# Patient Record
Sex: Male | Born: 1935 | Race: Asian | Hispanic: No | Marital: Married | Smoking: Former smoker
Health system: Southern US, Community
[De-identification: ages and names within clinical notes are randomized; demographics above are authoritative.]

## PROBLEM LIST (undated history)

## (undated) DIAGNOSIS — I10 Essential (primary) hypertension: Secondary | ICD-10-CM

## (undated) DIAGNOSIS — I2111 ST elevation (STEMI) myocardial infarction involving right coronary artery: Secondary | ICD-10-CM

## (undated) DIAGNOSIS — F0390 Unspecified dementia without behavioral disturbance: Secondary | ICD-10-CM

## (undated) DIAGNOSIS — I5042 Chronic combined systolic (congestive) and diastolic (congestive) heart failure: Secondary | ICD-10-CM

## (undated) DIAGNOSIS — R55 Syncope and collapse: Secondary | ICD-10-CM

## (undated) DIAGNOSIS — I2510 Atherosclerotic heart disease of native coronary artery without angina pectoris: Secondary | ICD-10-CM

## (undated) DIAGNOSIS — N183 Chronic kidney disease, stage 3 unspecified: Secondary | ICD-10-CM

## (undated) DIAGNOSIS — D539 Nutritional anemia, unspecified: Secondary | ICD-10-CM

## (undated) DIAGNOSIS — Z8673 Personal history of transient ischemic attack (TIA), and cerebral infarction without residual deficits: Secondary | ICD-10-CM

## (undated) DIAGNOSIS — R300 Dysuria: Secondary | ICD-10-CM

## (undated) DIAGNOSIS — R4182 Altered mental status, unspecified: Secondary | ICD-10-CM

## (undated) DIAGNOSIS — Z9889 Other specified postprocedural states: Secondary | ICD-10-CM

## (undated) DIAGNOSIS — R609 Edema, unspecified: Secondary | ICD-10-CM

## (undated) DIAGNOSIS — E785 Hyperlipidemia, unspecified: Secondary | ICD-10-CM

## (undated) DIAGNOSIS — C61 Malignant neoplasm of prostate: Secondary | ICD-10-CM

## (undated) DIAGNOSIS — I639 Cerebral infarction, unspecified: Secondary | ICD-10-CM

## (undated) HISTORY — DX: Personal history of transient ischemic attack (TIA), and cerebral infarction without residual deficits: Z86.73

## (undated) HISTORY — DX: Syncope and collapse: R55

## (undated) HISTORY — DX: Atherosclerotic heart disease of native coronary artery without angina pectoris: I25.10

## (undated) HISTORY — DX: Chronic kidney disease, stage 3 unspecified: N18.30

## (undated) HISTORY — DX: Edema, unspecified: R60.9

## (undated) HISTORY — DX: Other specified postprocedural states: Z98.890

## (undated) HISTORY — PX: CATARACT EXTRACTION: SUR2

## (undated) HISTORY — DX: Dysuria: R30.0

## (undated) HISTORY — DX: Essential (primary) hypertension: I10

## (undated) HISTORY — DX: Unspecified dementia, unspecified severity, without behavioral disturbance, psychotic disturbance, mood disturbance, and anxiety: F03.90

## (undated) HISTORY — DX: Nutritional anemia, unspecified: D53.9

## (undated) HISTORY — DX: Chronic kidney disease, stage 3 (moderate): N18.3

## (undated) HISTORY — DX: Malignant neoplasm of prostate: C61

## (undated) HISTORY — DX: Altered mental status, unspecified: R41.82

---

## 2009-02-07 ENCOUNTER — Encounter: Payer: Self-pay | Admitting: Internal Medicine

## 2010-01-11 DIAGNOSIS — R55 Syncope and collapse: Secondary | ICD-10-CM

## 2010-01-11 HISTORY — DX: Syncope and collapse: R55

## 2010-01-25 ENCOUNTER — Encounter: Payer: Self-pay | Admitting: Internal Medicine

## 2010-04-07 ENCOUNTER — Ambulatory Visit: Payer: Self-pay | Admitting: Internal Medicine

## 2010-04-07 DIAGNOSIS — I10 Essential (primary) hypertension: Secondary | ICD-10-CM | POA: Insufficient documentation

## 2010-04-07 DIAGNOSIS — R55 Syncope and collapse: Secondary | ICD-10-CM | POA: Insufficient documentation

## 2010-04-07 DIAGNOSIS — N400 Enlarged prostate without lower urinary tract symptoms: Secondary | ICD-10-CM

## 2010-04-07 DIAGNOSIS — Z8546 Personal history of malignant neoplasm of prostate: Secondary | ICD-10-CM

## 2010-04-12 ENCOUNTER — Ambulatory Visit: Payer: Self-pay | Admitting: Family

## 2010-04-21 ENCOUNTER — Ambulatory Visit: Payer: Self-pay | Admitting: Internal Medicine

## 2010-05-27 ENCOUNTER — Ambulatory Visit: Payer: Self-pay | Admitting: Internal Medicine

## 2010-09-01 ENCOUNTER — Ambulatory Visit: Payer: Self-pay | Admitting: Internal Medicine

## 2010-09-28 ENCOUNTER — Ambulatory Visit: Payer: Self-pay | Admitting: Internal Medicine

## 2010-09-28 ENCOUNTER — Ambulatory Visit (HOSPITAL_BASED_OUTPATIENT_CLINIC_OR_DEPARTMENT_OTHER)
Admission: RE | Admit: 2010-09-28 | Discharge: 2010-09-28 | Payer: Self-pay | Source: Home / Self Care | Admitting: Internal Medicine

## 2010-09-28 ENCOUNTER — Ambulatory Visit: Payer: Self-pay | Admitting: Diagnostic Radiology

## 2010-09-28 DIAGNOSIS — R109 Unspecified abdominal pain: Secondary | ICD-10-CM | POA: Insufficient documentation

## 2010-09-28 LAB — CONVERTED CEMR LAB
ALT: 12 units/L (ref 0–53)
Basophils Absolute: 0 10*3/uL (ref 0.0–0.1)
Bilirubin, Direct: 0.1 mg/dL (ref 0.0–0.3)
Chloride: 105 meq/L (ref 96–112)
Creatinine, Ser: 1.86 mg/dL — ABNORMAL HIGH (ref 0.40–1.50)
Eosinophils Relative: 2 % (ref 0–5)
Indirect Bilirubin: 0.2 mg/dL (ref 0.0–0.9)
Lymphocytes Relative: 22 % (ref 12–46)
Neutro Abs: 5.9 10*3/uL (ref 1.7–7.7)
Platelets: 329 10*3/uL (ref 150–400)
Potassium: 5.2 meq/L (ref 3.5–5.3)
RDW: 13.5 % (ref 11.5–15.5)
Sed Rate: 17 mm/hr — ABNORMAL HIGH (ref 0–16)

## 2010-09-29 ENCOUNTER — Telehealth: Payer: Self-pay | Admitting: Internal Medicine

## 2010-11-02 ENCOUNTER — Encounter: Payer: Self-pay | Admitting: Internal Medicine

## 2010-11-02 ENCOUNTER — Ambulatory Visit: Payer: Self-pay | Admitting: Internal Medicine

## 2010-11-02 DIAGNOSIS — R7309 Other abnormal glucose: Secondary | ICD-10-CM

## 2010-11-02 DIAGNOSIS — N184 Chronic kidney disease, stage 4 (severe): Secondary | ICD-10-CM

## 2010-11-02 LAB — CONVERTED CEMR LAB
CO2: 21 meq/L (ref 19–32)
Calcium: 9.4 mg/dL (ref 8.4–10.5)
Glucose, Bld: 100 mg/dL — ABNORMAL HIGH (ref 70–99)
Sodium: 140 meq/L (ref 135–145)

## 2010-11-03 ENCOUNTER — Telehealth: Payer: Self-pay | Admitting: Internal Medicine

## 2010-11-03 ENCOUNTER — Ambulatory Visit: Payer: Self-pay | Admitting: Internal Medicine

## 2010-12-13 NOTE — Medication Information (Signed)
Summary: Patient Meds/[facility]  Patient Meds/[facility]   Imported By: Lanelle Bal 05/02/2010 13:54:36  _____________________________________________________________________  External Attachment:    Type:   Image     Comment:   External Document

## 2010-12-13 NOTE — Assessment & Plan Note (Signed)
Summary: BP 166/90/[facility]--room 5   Vital Signs:  Patient profile:   77 year old male Height:      69 inches Weight:      161.50 pounds BMI:     23.94 Temp:     97.8 degrees F oral Pulse rate:   84 / minute Pulse rhythm:   regular Resp:     18 per minute BP sitting:   154 / 86  (right arm) Cuff size:   regular  Vitals Entered By: Fred Griffin CMA (Apr 12, 2010 1:29 PM) CC: room 5  Pt states BP has been running high. Recent BP at home 166/90. Was told to call us if BP elevated. Is Patient Diabetic? No   Primary Care Provider:  DThomos Lemons DO  CC:  room 5  Pt states BP has been running high. Recent BP at home 166/90. Was told to call us if BP elevated.Marland Kitchen  History of Present Illness: Fred Griffin is an 77 year old male who presents today to follow up on his hypertension.  He saw Dr. Artist Pais for an initial visit on 5/26.  At this visit his Lisinopril was held due to orthostasis.  This visit was following a recent admission to [facility] for a syncopal episode. He was instructed to call if his SBP was greater than 140.  Patient has been monitoring his BP at home-  reports BP's have been high 140/75-80 last night, 166/90 at home.  History is limited by language barrier as patient speaks very little [place].  Allergies (verified): No Known Drug Allergies  Physical Exam  General:  Well-developed,well-nourished,Matis no acute distress; alert,appropriate and cooperative throughout examination Head:  Normocephalic and atraumatic without obvious abnormalities. No apparent alopecia or balding. Lungs:  Normal respiratory effort, chest expands symmetrically. Lungs are clear to auscultation, no crackles or wheezes. Heart:  Normal rate and regular rhythm. S1 and S2 normal without gallop, murmur, click, rub or other extra sounds. Neurologic:  No cranial nerve deficits noted. Station and gait are normal.  Subjective dizziness with standing.   Impression & Recommendations:  Problem # 1:   HYPERTENSION (ICD-401.9) Assessment Comment Only 77 year old male presents with records of elevated blood pressure at home.  Currently off of lisinopril and flomax.  140/82 HR 72 laying 148/85 HR 80 148/80 HR 84 Patient is not significantly orthostatic today, but has symptomatic dizziness with standing.  Will not resume BP med at this time.   Will discuss further with Dr. Artist Pais.   BP today: 154/86 Prior BP: 114/70 (04/07/2010)  Patient Instructions: 1)  Do not take your blood pressure medicine (lisinopril).  We will contact you with further instructions.  Current Allergies (reviewed today): No known allergies

## 2010-12-13 NOTE — Assessment & Plan Note (Signed)
Summary: 3 month fu/dt   Vital Signs:  Patient profile:   77 year old male Height:      69 inches Weight:      164.50 pounds BMI:     24.38 O2 Sat:      100 % on Room air Temp:     98.2 degrees F oral Pulse rate:   79 / minute Pulse rhythm:   irregular Resp:     22 per minute BP sitting:   126 / 82  (right arm) Cuff size:   large  Vitals Entered By: Glendell Docker CMA (September 01, 2010 8:00 AM)  O2 Flow:  Room air CC: 3 Month follow up Is Patient Diabetic? No Pain Assessment Patient Drystan pain? no        Primary Care Provider:  Dondra Spry DO  CC:  3 Month follow up.  History of Present Illness:  Hypertension Follow-Up      This is an 77 year old man who presents for Hypertension follow-up.  The patient denies lightheadedness.  The patient denies the following associated symptoms: chest pain.  Compliance with medications (by patient report) has been near 100%.  pt has chronic intermittent cough  Preventive Screening-Counseling & Management  Alcohol-Tobacco     Smoking Status: current  Allergies (verified): No Known Drug Allergies  Past History:  Past Medical History: Prostate cancer, hx of - Followed by Dr. Margo Aye (urologist [facility]) Hypertension    Syncope 01/2010   Past Surgical History:  Hx of EGD,  colonoscopy   Cataract extraction x 2     Family History: Family History Hypertension Family History of Prostate CA 1st degree relative <50 brother hx of intestinal cancer 8 siblings        Social History: Retired  Tourist information centre manager up [place]  Married - wife lives [place], [place] with daughter) 2 sons ( 1 son Fred [place], 1 son lives nearby)  2 daughters Current Smoker - 1 pack q 3 days      Physical Exam  General:  alert, well-developed, and well-nourished.   Lungs:  normal respiratory effort and normal breath sounds.   Heart:  normal rate, regular rhythm, and no gallop.   Extremities:  trace left pedal edema and trace right pedal edema.     Impression &  Recommendations:  Problem # 1:  HYPERTENSION (ICD-401.9) pt with intermittent dry cough.  change to ARB.   His updated medication list for this problem includes:    Losartan Potassium 25 Mg Tabs (Losartan potassium) .Marland Kitchen... 1/2 to one tab by mouth qam  BP today: 126/82 Prior BP: 120/70 (05/27/2010)  Complete Medication List: 1)  Losartan Potassium 25 Mg Tabs (Losartan potassium) .... 1/2 to one tab by mouth qam 2)  Tamsulosin Hcl 0.4 Mg Caps (Tamsulosin hcl) .... One by mouth qd  Patient Instructions: 1)  Please schedule a follow-up appointment [place] 2 months. 2)  Stop taking Benazepril 3)  Use new blood pressure medication as directed Prescriptions: LOSARTAN POTASSIUM 25 MG TABS (LOSARTAN POTASSIUM) 1/2 to one tab by mouth qam  #30 x 3   Entered and Authorized by:   D. Thomos Lemons DO   Signed by:   D. Thomos Lemons DO on 09/01/2010   Method used:   Electronically to        [facility] (retail)       [address]       [place]  [place], [place]  [address]       Ph: [phone] or [phone]       Fax: [phone]   RxID:   [id number]    Orders Added: 1)  Est. Patient Level III [[address]]   Immunization History:  Influenza Immunization History:    Influenza:  historical (08/02/2010)   Immunization History:  Influenza Immunization History:    Influenza:  Historical (08/02/2010)   Current Allergies (reviewed today): No known allergies

## 2010-12-13 NOTE — Progress Notes (Signed)
Summary: Status Update  Phone Note Outgoing Call   Summary of Call: call Fred Griffin - CT of abd and pelvis shows inflammation Shaka loops of small bowel.  this may be due to infectious process. I suggest course of abx - see rx  Also - kidney function slightly worse.  Plz make sure Fred Griffin only taking 1/2 of losartan.  Fred Griffin needs to increase his fluid intake plz advise Fred Griffin to hold losartan x 1 week  ROV next week.  If abd pain gets worse,  Fred Griffin needs to go to ER. Initial call taken by: D. Thomos Lemons DO,  September 29, 2010 8:49 AM  Follow-up for Phone Call        call was placed to patient at [phone], he was advised per Dr Artist Pais instructions. He states that he feels bad and is on his way to the hospital now.  Follow-up by: Glendell Docker CMA,  September 29, 2010 9:16 AM  Additional Follow-up for Phone Call Additional follow up Details #1::        Fred Griffin came to office for update Fred Griffin feeling somewhat better reviewed results of CT scan and blood work Fred Griffin understands instructions take abx and increase fluids ROV Raziel one week Additional Follow-up by: D. Thomos Lemons DO,  September 29, 2010 9:56 AM    New/Updated Medications: LOSARTAN POTASSIUM 25 MG TABS (LOSARTAN POTASSIUM) 1/2 tab by mouth qam CIPROFLOXACIN HCL 250 MG TABS (CIPROFLOXACIN HCL) one by mouth two times a day METRONIDAZOLE 250 MG TABS (METRONIDAZOLE) one by mouth three times a day Prescriptions: METRONIDAZOLE 250 MG TABS (METRONIDAZOLE) one by mouth three times a day  #21 x 0   Entered and Authorized by:   D. Thomos Lemons DO   Signed by:   D. Thomos Lemons DO on 09/29/2010   Method used:   Electronically to        [facility] (retail)       [address]       [place], [place]  [address]       Ph: [phone] or [phone]       Fax: [phone]   RxID:   [id number] CIPROFLOXACIN HCL 250 MG TABS (CIPROFLOXACIN HCL) one by mouth two times a day  #15 x 0   Entered and Authorized by:   D. Thomos Lemons DO  Signed by:   D. Thomos Lemons DO on 09/29/2010   Method used:   Electronically to        [facility] (retail)       [address]       [place], [place]  [address]       Ph: [phone] or [phone]       Fax: [phone]   RxID:   [id number]

## 2010-12-13 NOTE — Assessment & Plan Note (Signed)
Summary: new to est/mhf   Vital Signs:  Patient profile:   77 year old male Height:      69 inches Weight:      159.75 pounds BMI:     23.68 O2 Sat:      99 % on Room air Temp:     98.3 degrees F oral Pulse rate:   75 / minute Pulse rhythm:   regular Resp:     16 per minute BP sitting:   114 / 70  (left arm) Cuff size:   regular  Vitals Entered By: Glendell Docker CMA (Apr 07, 2010 9:28 AM)  O2 Flow:  Room air CC: Rm 2- New Patient    Primary Care Provider:  Dondra Spry DO  CC:  Rm 2- New Patient .  History of Present Illness: 77 y/o [place] male to establish moved to [place] 1980s.  he was prev treated for htn [facility] [place] the past.  his [place] physician passed away.  he was then seen by Dr. Tresa Endo.  BP medication dose was increased but he experienced dizziness and syncope and evaluated at [facility] hospital.    BP meds  decreased to 5 mg (lisinopril) still feels lightheaded with standing  hx of BPH then diagnosed with prostate ca tx with radiation PSA q 6 months take flomax good urine flow.  minimal obstructive symptoms  tobacco abuse - tried nicotine patch   [facility] records reviewed: 02/07/2009 hemoglobin 12.1, hematocrit 34.7, platelet count 261 Sodium 140, potassium 5.2, glucose 139, creatinine 1.87  CT head without contrast No acute intracranial findings, chronic ischemic microvascular disease  Chest x-ray No acute cardiopulmonary disease. Evidence of prior granulomatous disease with biapical scarring   Preventive Screening-Counseling & Management  Alcohol-Tobacco     Alcohol drinks/day: 0     Smoking Status: current     Packs/Day: 0.25     Year Started: 1958  Caffeine-Diet-Exercise     Caffeine use/day: 1 cup coffee daily     Does Patient Exercise: yes     Type of exercise: treadmill  EKG  Procedure date:  04/07/2010  Findings:      Normal sinus rhythm with rate of:  77 bpm  Allergies (verified): No Known Drug Allergies  Past  History:  Past Medical History: Prostate cancer, hx of Hypertension Syncope 01/2010  Past Surgical History:  Hx of EGD,  colonoscopy  Cataract extraction x 2  Family History: Family History Hypertension Family History of Prostate CA 1st degree relative <50 brother hx of intestinal cancer 8 siblings    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         All other systems were reviewed and were negative.   Physical Exam  General:  alert, well-developed, and well-nourished.   Head:  normocephalic and atraumatic.   Eyes:  pupils equal, pupils round, and pupils reactive to light.   Mouth:  upper and lower dental plate. Neck:  supple, no masses, and no carotid bruits.   Lungs:  normal respiratory effort, normal breath sounds, no  crackles, and no wheezes.   Heart:  normal rate, regular rhythm, no murmur, and no gallop.   Abdomen:  soft, non-tender, normal bowel sounds, no masses, no hepatomegaly, and no splenomegaly.   Extremities:  No lower extremity edema  Neurologic:  cranial nerves II-XII intact and gait normal.   Psych:  normally interactive and good eye contact.     Impression & Recommendations:  Problem # 1:  SYNCOPE (ICD-780.2) 77 y/o with hx of syncope.  he is orthostatic.  DC lisinopril and flomax. pt to monitor BP at home ROV Liev 2 weeks.  Problem # 2:  PROSTATE CANCER, HX OF (ICD-V10.46) continue surveillance with urologist  Problem # 3:  HYPERTENSION (ICD-401.9) pt orthostatic.  SBP 120/70 sitting and 100/70 with standing -  manual cuff. DC meds The following medications were removed from the medication list:    Lisinopril 10 Mg Tabs (Lisinopril) .Marland Kitchen... Take 1 tablet by mouth once a day  BP today: 114/70  Problem # 4:  BENIGN PROSTATIC HYPERTROPHY, WITH OBSTRUCTION (ICD-600.01) pt with minimal obstructive symptoms flomas likely contributing to orhostasis.  dc flomax The following medications were removed from the medication list:    Flomax 0.4 Mg Caps (Tamsulosin hcl) .Marland Kitchen... Take 1 tablet by mouth once a day  Patient Instructions: 1)  Stop flomax 2)  Stop lisinopril  3)  Please schedule a follow-up appointment [name] 2 weeks 4)  Monitor your blood pressure at home. 5)  Call our office if your systolic blood pressure greater than 140.   Preventive Care Screening  Colonoscopy:    Date:  01/25/2010    Results:  Done   Last Pneumovax:    Date:  08/30/2009    Results:  given   Last Flu Shot:    Date:  08/30/2009    Results:  given    Current Allergies (reviewed today): No known allergies

## 2010-12-13 NOTE — Assessment & Plan Note (Signed)
Summary: 1 week fu per Dr Idelle Jo could not sleep has headache/mhf   Vital Signs:  Patient profile:   77 year old male Height:      69 inches Weight:      160.25 pounds BMI:     23.75 O2 Sat:      99 % on Room air Temp:     98.4 degrees F oral Pulse rate:   77 / minute Pulse rhythm:   regular Resp:     18 per minute BP sitting:   140 / 80  (right arm) Cuff size:   regular  Vitals Entered By: Glendell Docker CMA (April 21, 2010 9:56 AM)  O2 Flow:  Room air CC: Rm 3- 1 week follow up  Is Patient Diabetic? No   Primary Care Provider:  DThomos Lemons DO  CC:  Rm 3- 1 week follow up .  History of Present Illness: 77 y/o [place] male for f/u lisinopril prev DC ed due to orthostasis. he has noticed bp elevation.  he reports assoc headache with elevated bp he stopped flomas as directed.  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   Lungs:  normal respiratory effort and normal breath sounds.   Heart:  normal rate, regular rhythm, and no gallop.   Extremities:  No lower extremity edema   Impression & Recommendations:  Problem # 1:  HYPERTENSION (ICD-401.9) use 1/2 of benazepril instead of lisinopril.  His updated medication list for this problem includes:    Benazepril Hcl 5 Mg Tabs (Benazepril hcl) .Marland Kitchen... 1/2 by mouth once daily  BP today: 140/80 Prior BP:  154/86 (04/12/2010)  Problem # 2:  BENIGN PROSTATIC HYPERTROPHY, WITH OBSTRUCTION (ICD-600.01) pt reports increase Kimarion nocturia since stopping flomax.  use finasteride.  pt understands to avoid caffeine. His updated medication list for this problem includes:    Finasteride 5 Mg Tabs (Finasteride) ..... One by mouth once daily  Complete Medication List: 1)  Benazepril Hcl 5 Mg Tabs (Benazepril hcl) .... 1/2 by mouth once daily 2)  Finasteride 5 Mg Tabs (Finasteride) .... One by mouth once daily  Patient Instructions: 1)  Please schedule a follow-up appointment Areon 6 weeks. Prescriptions: FINASTERIDE 5 MG TABS (FINASTERIDE) one by mouth once daily  #30 x 0   Entered and Authorized by:   D. Thomos Lemons DO   Signed by:   D. Thomos Lemons DO on 04/21/2010   Method used:   Electronically to        [facility] (retail)       [address]       [place], [place]  [address]       Ph: [phone] or [phone]       Fax: [phone]  RxID:   [id number] BENAZEPRIL HCL 5 MG TABS (BENAZEPRIL HCL) 1/2 by mouth once daily  #30 x 2   Entered and Authorized by:   D. Thomos Lemons DO   Signed by:   D. Thomos Lemons DO on 04/21/2010   Method used:   Electronically to        [facility] (retail)       [address]       [place], [place]  [address]       Ph: [phone] or [phone]       Fax: [phone]   RxID:   [id number]   Current Allergies (reviewed today): No known allergies

## 2010-12-13 NOTE — Assessment & Plan Note (Signed)
Summary: stomach pain/mhf   Vital Signs:  Patient profile:   77 year old male Height:      69 inches Weight:      161 pounds BMI:     23.86 O2 Sat:      97 % on Room air Temp:     98.1 degrees F oral Pulse rate:   90 / minute Resp:     24 per minute BP sitting:   114 / 80  (right arm) Cuff size:   regular  Vitals Entered By: Glendell Docker CMA (September 28, 2010 10:40 AM)  O2 Flow:  Room air CC: Stomach discomfort  Is Patient Diabetic? No Pain Assessment Patient Cindy pain? no      Comments c/o onset of yesterday with abdominal pain, no vomiting, or diarrhea, pain when pressure appiled to abdomen   Primary Care Provider:  Dondra Spry DO  CC:  Stomach discomfort .  History of Present Illness: yesterday noticed abd pain after breakfast abd bloated , no burning no diarrhea no fever  no change [place] bowel habits  Preventive Screening-Counseling & Management  Alcohol-Tobacco     Smoking Status: current  Allergies (verified): No Known Drug Allergies  Past History:  Past Medical History: Prostate cancer, hx of - Followed by Dr. 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   Lungs:  normal respiratory effort and normal breath sounds.   Heart:  normal rate, regular rhythm, and no gallop.   Abdomen:  RLQ tenderness,  soft, no guarding, no rigidity, and no rebound tenderness.     Impression & Recommendations:  Problem # 1:  ABDOMINAL PAIN, ACUTE  (ICD-789.00) 77 y/o with RLQ pain.  rule out appendicitis.   Orders: T-Basic Metabolic Panel [id number]) T-Hepatic Function [id number]) T-CBC w/Diff [phone]) T-Lipase [id number]) T-Sed Rate (Automated) [phone]) CT without Contrast (CT w/o contrast)  Complete Medication List: 1)  Losartan Potassium 25 Mg Tabs (Losartan potassium) .Marland Kitchen.. 1 tab by mouth qam 2)  Tamsulosin Hcl 0.4 Mg Caps (Tamsulosin hcl) .... One by mouth qd Prescriptions: LOSARTAN POTASSIUM 25 MG TABS (LOSARTAN POTASSIUM) 1 tab by mouth qam  #90 x 1   Entered and Authorized by:   D. Thomos Lemons DO   Signed by:   D. Thomos Lemons DO on 09/28/2010   Method used:   Electronically to        [facility] (retail)       [address]       [place], [place]  [address]       Ph: [phone] or [phone]       Fax: [phone]   RxID:   [id number]    Orders Added: 1)  T-Basic Metabolic Panel [id number] 2)  T-Hepatic Function [[id number]] 3)  T-CBC w/Diff [[id number]] 4)  T-Lipase [[id number]] 5)  T-Sed Rate (Automated) [[id number]] 6)  CT without Contrast [CT w/o contrast] 7)  Est. Patient Level III [[address]]    Current Allergies (reviewed today): No known allergies

## 2010-12-13 NOTE — Letter (Signed)
Summary: Records from [facility] [facility] 2007 - 2011  Records from [place] [facility] 2007 - 2011   Imported By: Maryln Gottron 09/09/2010 15:34:47  _____________________________________________________________________  External Attachment:    Type:   Image     Comment:   External Document

## 2010-12-13 NOTE — Assessment & Plan Note (Signed)
Summary: 6 WEEK FOLLOW UP/MHF   Vital Signs:  Patient profile:   77 year old male Weight:      159.50 pounds BMI:     23.64 O2 Sat:      98 % on Room air Temp:     97.9 degrees F oral Pulse rate:   83 / minute Pulse rhythm:   regular Resp:     22 per minute BP sitting:   120 / 70  (right arm) Cuff size:   regular  Vitals Entered By: Fred Griffin CMA (May 27, 2010 8:20 AM)  O2 Flow:  Room air CC: Rm 3- 6 Week Follow up  Is Patient Diabetic? No Griffin Assessment Patient Fred Griffin? no       Does patient need assistance? Functional Status Self care Ambulation Normal   Primary Care Provider:  DThomos Lemons DO  CC:  Rm 3- 6 Week Follow up .  History of Present Illness:  Hypertension Follow-Up      This is an 77 year old man who presents for Hypertension follow-up.  The patient denies lightheadedness, headaches, and edema.  The patient denies the following associated symptoms: chest Griffin.  Compliance with medications (by patient report) has been near 100%.  The patient reports that dietary compliance has been fair.    BPH - he stopped finasteride.  it did not help obstructive symptoms.  he resumed flomax.  no pre syncope  Preventive Screening-Counseling & Management  Alcohol-Tobacco     Smoking Status: current  Allergies: No Known Drug Allergies  Past History:  Past Medical History: Prostate cancer, hx of - Followed by Dr. Margo Griffin (urologist [facility]) Hypertension    Syncope 01/2010  Past Surgical History:  Hx of EGD,  colonoscopy   Cataract extraction x 2    Family History: Family History Hypertension Family History of Prostate CA 1st degree relative <50 brother hx of intestinal cancer 8 siblings       Social History: Retired  Tourist information centre manager up [place]  Married - wife lives [place], [place] with daughter) 2 sons ( 1 son Fred [place], 1 son lives nearby)  2 daughters Current Smoker - 1 pack q 3 days     Review of Systems       intermittent cough.  worse after he  smokes.  he plans to quit  Physical Exam  General:  alert, well-developed, and well-nourished.   Lungs:  normal respiratory effort and normal breath sounds.   Heart:  normal rate, regular rhythm, and no gallop.   Extremities:  trace left pedal edema and trace right pedal edema.     Impression & Recommendations:  Problem # 1:  HYPERTENSION (ICD-401.9) Assessment Improved pt has been taking full tab of benazepril.  BP is sometimes elevated Fred Griffin AM.  Pt advised to take benazepril at bedtime.  His updated medication list for this problem includes:    Benazepril Hcl 5 Mg Tabs (Benazepril hcl) .Marland Kitchen... 1 tab by mouth once daily  BP today: 120/70 Prior BP: 140/80 (04/21/2010)  Problem # 2:  BENIGN PROSTATIC HYPERTROPHY, WITH OBSTRUCTION (ICD-600.01) no improvement with finasteride.  he resumed flomax.  no orthostasis since changing lisinopril to benazepril  His updated medication list for this problem includes:    Tamsulosin Hcl 0.4 Mg Caps (Tamsulosin hcl) ..... One by mouth qd  Complete Medication List: 1)  Benazepril Hcl 5 Mg Tabs (Benazepril hcl) .Marland Kitchen.. 1 tab by mouth once daily 2)  Tamsulosin Hcl 0.4 Mg Caps (  Tamsulosin hcl) .... One by mouth qd  Patient Instructions: 1)  Please schedule a follow-up appointment Fred Griffin 3 months. Prescriptions: TAMSULOSIN HCL 0.4 MG CAPS (TAMSULOSIN HCL) one by mouth qd  #30 x 5   Entered and Authorized by:   D. Thomos Lemons DO   Signed by:   D. Thomos Lemons DO on 05/27/2010   Method used:   Electronically to        [facility] (retail)       [address]       [place], [place]  [address]       Ph: [phone] or [phone]       Fax: [phone]   RxID:   [id number] BENAZEPRIL HCL 5 MG TABS (BENAZEPRIL HCL) 1 tab by mouth once daily  #30 x 5   Entered and Authorized by:   D. Thomos Lemons DO   Signed by:   D. Thomos Lemons DO on 05/27/2010   Method used:   Electronically to        [facility]  (retail)       [address]       [place], [place]  [address]       Ph: [phone] or [phone]       Fax: [phone]   RxID:   [id number]

## 2010-12-15 NOTE — Progress Notes (Signed)
Summary: Lab Results  Phone Note Outgoing Call   Summary of Call: call pt - kidney function improved.  he is borderline diabetic.  plz advise pt to avoid sweets and decrease intake of  carbohydrates.  no diabetic meds for now.  I will discuss further at next OV Initial call taken by: D. Thomos Lemons DO,  November 03, 2010 11:51 AM  Follow-up for Phone Call        call placed to patient at [phone], he has been advised per Dr Artist Pais instructions.  Patient has verbalized understanding. Follow-up by: Glendell Docker CMA,  November 03, 2010 12:30 PM

## 2010-12-15 NOTE — Assessment & Plan Note (Signed)
Summary: 2 MONTH FOLLOW UP/MHF   Vital Signs:  Patient profile:   77 year old male Height:      69 inches Weight:      162 pounds BMI:     24.01 O2 Sat:      100 % on Room air Temp:     97.6 degrees F oral Pulse rate:   87 / minute Resp:     26 per minute BP sitting:   108 / 80  (right arm)  Vitals Entered By: Fred Griffin CMA (November 02, 2010 8:12 AM)  O2 Flow:  Room air CC: 2 month Follow up Is Patient Diabetic? No Pain Assessment Patient Fred Griffin pain? no        Primary Care Provider:  Dondra Spry DO  CC:  2 month Follow up.  History of Present Illness: 64 yc/o fluctuating blood pressure,  he has been taking 1 full tablet of the Losartan  Preventive Screening-Counseling & Management  Alcohol-Tobacco     Smoking Status: current  Allergies (verified): No Known Drug Allergies  Past History:  Past Medical History: Prostate cancer, hx of - Followed by Dr. 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   Lungs:  normal respiratory effort and normal breath sounds.   Heart:  normal rate, regular rhythm, and no gallop.     Impression & Recommendations:  Problem # 1:  HYPERTENSION (ICD-401.9) pt with labile bp.  split losartan dosing to two times a day  His updated medication list for this problem includes:    Losartan Potassium 25 Mg Tabs (Losartan potassium) .Marland Kitchen... Take 1/2 tablet by mouth two times a day  Orders: T-Basic Metabolic Panel [phone])  BP today: 108/80 Prior BP: 114/80 (09/28/2010)  Labs Reviewed: K+: 5.2 (09/28/2010) Creat: : 1.86 (09/28/2010)     Problem # 2:  RENAL  INSUFFICIENCY (ICD-588.9) Assessment: Unchanged recent CT of abd and pelvis wo contrast notes Low-density renal lesions, some definitely representing cysts but others are indeterminate consider renal u/s Fred Griffin 6 months pt advised to avoid NSAIDs  Problem # 3:  HYPERGLYCEMIA (ICD-790.29)  Orders: T- Hemoglobin A1C ([id number])  Labs Reviewed: Creat: 1.86 (09/28/2010)     Complete Medication List: 1)  Losartan Potassium 25 Mg Tabs (Losartan potassium) .... Take 1/2 tablet by mouth two times a day 2)  Tamsulosin Hcl 0.4 Mg Caps (Tamsulosin hcl) .... One by mouth qd  Patient Instructions: 1)  Bring your home blood pressure with you for next office visit 2)  Please schedule a follow-up appointment Fred Griffin 3 months. 3)  Avoid NSAIDs (ibuprofen, motrin, aleve) Prescriptions: TAMSULOSIN HCL 0.4 MG CAPS (TAMSULOSIN HCL) one by mouth qd  #90 x 1   Entered and Authorized by:   D. Thomos Lemons DO   Signed by:   D. Thomos Lemons DO on 11/02/2010   Method used:   Electronically to        [facility] (retail)       [address]       [place], [place]  [address]       Ph: [phone] or [phone]       Fax: [phone]   RxID:   [id number] LOSARTAN POTASSIUM 25 MG TABS (LOSARTAN POTASSIUM) Take 1/2 tablet by mouth two times a day  #90 x 1   Entered and Authorized by:   D. Thomos Lemons DO   Signed by:   D. Thomos Lemons DO on 11/02/2010   Method used:   Electronically to        [facility] (retail)       [address]       [place], [place]  [address]       Ph: [phone] or [phone]       Fax: [phone]   RxID:   [id number]    Orders Added: 1)  T-Basic Metabolic Panel [phone] 2)  T- Hemoglobin A1C [[id number]] 3)  Est. Patient Level III [70623]     Current Allergies (reviewed today): No known allergies

## 2011-02-01 ENCOUNTER — Ambulatory Visit (INDEPENDENT_AMBULATORY_CARE_PROVIDER_SITE_OTHER): Payer: Medicare Other | Admitting: Internal Medicine

## 2011-02-01 ENCOUNTER — Encounter: Payer: Self-pay | Admitting: Internal Medicine

## 2011-02-01 VITALS — BP 124/80 | HR 84 | Temp 97.4°F | Resp 22 | Ht 69.0 in | Wt 166.0 lb

## 2011-02-01 DIAGNOSIS — N259 Disorder resulting from impaired renal tubular function, unspecified: Secondary | ICD-10-CM

## 2011-02-01 DIAGNOSIS — N281 Cyst of kidney, acquired: Secondary | ICD-10-CM | POA: Insufficient documentation

## 2011-02-01 DIAGNOSIS — N289 Disorder of kidney and ureter, unspecified: Secondary | ICD-10-CM

## 2011-02-01 DIAGNOSIS — I10 Essential (primary) hypertension: Secondary | ICD-10-CM

## 2011-02-01 MED ORDER — TAMSULOSIN HCL 0.4 MG PO CAPS: 0.4000 mg | ORAL_CAPSULE | Freq: Every day | ORAL | Status: DC

## 2011-02-01 MED ORDER — LOSARTAN POTASSIUM 25 MG PO TABS: ORAL_TABLET | ORAL | Status: DC

## 2011-02-01 NOTE — Assessment & Plan Note (Signed)
Pt still experiencing labile blood pressure Splitting losartan dose has helped We tried to DC flomax but urine flow significantly deteriorated Consider change to hytrin 1 mg

## 2011-02-01 NOTE — Progress Notes (Signed)
  Subjective:    Patient ID: Fred Griffin, male    DOB: [birth year], 77 y.o.   MRN: [id number]  HPI 77 y/o [place] male for follow up Pt still getting somewhat labile blood pressure readings Splitting dose of losartan helps We have tried to stop flomax Fred Griffin the past but he experiencing significant decline Fred Griffin urine flow  DM II borderline - pt is avoiding sweets  Past Medical History  Diagnosis Date  . Hypertension   . Syncope 01/2010  . Prostate cancer     Followed by Dr Fred Griffin -  Urologist [facility]  . History of colonoscopy      Review of Systems No weight loss Normal urination    Objective:   Physical Exam  Constitutional: He is oriented to person, place, and time. He appears well-developed and well-nourished.  Eyes: Pupils are equal, round, and reactive to light.  Cardiovascular: Normal rate, regular rhythm and normal heart sounds.   Pulmonary/Chest: Effort normal and breath sounds normal. No respiratory distress.  Neurological: He is alert and oriented to person, place, and time.  Skin: Skin is warm and dry.  Psychiatric: He has a normal mood and affect. His behavior is normal.          Assessment & Plan:

## 2011-02-01 NOTE — Assessment & Plan Note (Addendum)
Stable. Monitor BMET before next OV Reiterated importance of avoiding NSAIDs  Lab Results  Component Value Date   CREATININE 1.59* 11/02/2010   BUN 20 11/02/2010   NA 140 11/02/2010   K 4.5 11/02/2010   CL 107 11/02/2010   CO2 21 11/02/2010

## 2011-02-01 NOTE — Assessment & Plan Note (Signed)
77 y/o male has hx of renal insuff - likely medical renal dz CT of abd and pelvis ordered due to abd pain Jacorion November, 2011 It incidentially showed  Low density bilateral renal lesions are identified and nonspecific although most of them likely represents cysts. There is no evidence of hydronephrosis or urinary calculi.  Plan - monitor with renal u/s Jaydin May, 2012

## 2011-02-01 NOTE — Patient Instructions (Signed)
Please do not take any over the counter NSAIDs (advil, aleve, motrin etc) Our office will contact you Fred Griffin, 2012 re:  Scheduling renal ultrasound

## 2011-02-02 ENCOUNTER — Ambulatory Visit (HOSPITAL_BASED_OUTPATIENT_CLINIC_OR_DEPARTMENT_OTHER)
Admission: RE | Admit: 2011-02-02 | Discharge: 2011-02-02 | Disposition: A | Discharge status: Home / Self Care | Payer: Medicare Other | Source: Ambulatory Visit | Attending: Internal Medicine | Admitting: Internal Medicine

## 2011-02-02 DIAGNOSIS — K7689 Other specified diseases of liver: Secondary | ICD-10-CM | POA: Insufficient documentation

## 2011-02-02 DIAGNOSIS — N289 Disorder of kidney and ureter, unspecified: Secondary | ICD-10-CM | POA: Insufficient documentation

## 2011-02-02 DIAGNOSIS — Z8546 Personal history of malignant neoplasm of prostate: Secondary | ICD-10-CM | POA: Insufficient documentation

## 2011-02-02 DIAGNOSIS — R109 Unspecified abdominal pain: Secondary | ICD-10-CM | POA: Insufficient documentation

## 2011-02-09 ENCOUNTER — Other Ambulatory Visit: Payer: Self-pay | Admitting: Internal Medicine

## 2011-02-09 DIAGNOSIS — R7309 Other abnormal glucose: Secondary | ICD-10-CM

## 2011-02-09 DIAGNOSIS — I10 Essential (primary) hypertension: Secondary | ICD-10-CM

## 2011-02-10 ENCOUNTER — Telehealth: Payer: Self-pay | Admitting: Internal Medicine

## 2011-02-10 NOTE — Telephone Encounter (Signed)
PATIENT WOULD LIKE RESULTS OF ULTRASOUND

## 2011-02-10 NOTE — Telephone Encounter (Signed)
It shows bilateral kidney cysts.  I will discuss Lemond detail at next OV

## 2011-02-11 NOTE — Telephone Encounter (Signed)
Call placed patient at [phone], he was informed per Dr Artist Pais instructions

## 2011-05-02 ENCOUNTER — Telehealth: Payer: Self-pay | Admitting: Internal Medicine

## 2011-05-02 MED ORDER — LOSARTAN POTASSIUM 25 MG PO TABS: ORAL_TABLET | ORAL | Status: DC

## 2011-05-02 NOTE — Telephone Encounter (Signed)
Refill losartin 25 mg tablet take 1/2 tablet two times a day  Qty [facility]

## 2011-05-02 NOTE — Telephone Encounter (Signed)
Rx refill sent to pharmacy. 

## 2011-05-29 ENCOUNTER — Ambulatory Visit: Payer: Medicare Other | Admitting: Internal Medicine

## 2014-07-14 DIAGNOSIS — I2111 ST elevation (STEMI) myocardial infarction involving right coronary artery: Secondary | ICD-10-CM

## 2014-07-14 HISTORY — DX: ST elevation (STEMI) myocardial infarction involving right coronary artery: I21.11

## 2014-08-06 ENCOUNTER — Encounter (HOSPITAL_COMMUNITY): Payer: Self-pay | Admitting: General Practice

## 2014-08-06 ENCOUNTER — Encounter (HOSPITAL_COMMUNITY): Admission: EM | Disposition: A | Discharge status: Home / Self Care | Payer: Medicare Other | Attending: Cardiology

## 2014-08-06 ENCOUNTER — Inpatient Hospital Stay (HOSPITAL_COMMUNITY)
Admission: EM | Admit: 2014-08-06 | Discharge: 2014-08-10 | DRG: 246 | Disposition: A | Discharge status: Home / Self Care | Payer: Medicare Other | Source: Intra-hospital | Attending: Cardiology | Admitting: Cardiology

## 2014-08-06 DIAGNOSIS — I129 Hypertensive chronic kidney disease with stage 1 through stage 4 chronic kidney disease, or unspecified chronic kidney disease: Secondary | ICD-10-CM | POA: Diagnosis present

## 2014-08-06 DIAGNOSIS — I2584 Coronary atherosclerosis due to calcified coronary lesion: Secondary | ICD-10-CM | POA: Diagnosis present

## 2014-08-06 DIAGNOSIS — I252 Old myocardial infarction: Secondary | ICD-10-CM

## 2014-08-06 DIAGNOSIS — Z8546 Personal history of malignant neoplasm of prostate: Secondary | ICD-10-CM

## 2014-08-06 DIAGNOSIS — D649 Anemia, unspecified: Secondary | ICD-10-CM | POA: Diagnosis present

## 2014-08-06 DIAGNOSIS — I4901 Ventricular fibrillation: Secondary | ICD-10-CM | POA: Diagnosis present

## 2014-08-06 DIAGNOSIS — E875 Hyperkalemia: Secondary | ICD-10-CM | POA: Diagnosis present

## 2014-08-06 DIAGNOSIS — Q619 Cystic kidney disease, unspecified: Secondary | ICD-10-CM

## 2014-08-06 DIAGNOSIS — I2119 ST elevation (STEMI) myocardial infarction involving other coronary artery of inferior wall: Principal | ICD-10-CM | POA: Diagnosis present

## 2014-08-06 DIAGNOSIS — I2510 Atherosclerotic heart disease of native coronary artery without angina pectoris: Secondary | ICD-10-CM

## 2014-08-06 DIAGNOSIS — N184 Chronic kidney disease, stage 4 (severe): Secondary | ICD-10-CM | POA: Diagnosis present

## 2014-08-06 DIAGNOSIS — I498 Other specified cardiac arrhythmias: Secondary | ICD-10-CM | POA: Diagnosis present

## 2014-08-06 DIAGNOSIS — N183 Chronic kidney disease, stage 3 (moderate): Secondary | ICD-10-CM

## 2014-08-06 DIAGNOSIS — I2111 ST elevation (STEMI) myocardial infarction involving right coronary artery: Secondary | ICD-10-CM

## 2014-08-06 DIAGNOSIS — I10 Essential (primary) hypertension: Secondary | ICD-10-CM | POA: Diagnosis present

## 2014-08-06 DIAGNOSIS — N281 Cyst of kidney, acquired: Secondary | ICD-10-CM | POA: Diagnosis present

## 2014-08-06 DIAGNOSIS — F17200 Nicotine dependence, unspecified, uncomplicated: Secondary | ICD-10-CM | POA: Diagnosis present

## 2014-08-06 DIAGNOSIS — R079 Chest pain, unspecified: Secondary | ICD-10-CM | POA: Diagnosis present

## 2014-08-06 DIAGNOSIS — I213 ST elevation (STEMI) myocardial infarction of unspecified site: Secondary | ICD-10-CM | POA: Diagnosis present

## 2014-08-06 HISTORY — DX: ST elevation (STEMI) myocardial infarction involving right coronary artery: I21.11

## 2014-08-06 HISTORY — PX: LEFT HEART CATHETERIZATION WITH CORONARY ANGIOGRAM: SHX5451

## 2014-08-06 HISTORY — PX: CORONARY ANGIOPLASTY WITH STENT PLACEMENT: SHX49

## 2014-08-06 LAB — CBC WITH DIFFERENTIAL/PLATELET
BASOS ABS: 0 10*3/uL (ref 0.0–0.1)
Basophils Relative: 0 % (ref 0–1)
Eosinophils Absolute: 0 10*3/uL (ref 0.0–0.7)
Eosinophils Relative: 0 % (ref 0–5)
HEMATOCRIT: 28.9 % — AB (ref 39.0–52.0)
Hemoglobin: 9.7 g/dL — ABNORMAL LOW (ref 13.0–17.0)
LYMPHS ABS: 0.6 10*3/uL — AB (ref 0.7–4.0)
LYMPHS PCT: 8 % — AB (ref 12–46)
MCH: 30.5 pg (ref 26.0–34.0)
MCHC: 33.6 g/dL (ref 30.0–36.0)
MCV: 90.9 fL (ref 78.0–100.0)
MONO ABS: 0.2 10*3/uL (ref 0.1–1.0)
Monocytes Relative: 3 % (ref 3–12)
NEUTROS ABS: 7.1 10*3/uL (ref 1.7–7.7)
Neutrophils Relative %: 89 % — ABNORMAL HIGH (ref 43–77)
Platelets: 233 10*3/uL (ref 150–400)
RBC: 3.18 MIL/uL — ABNORMAL LOW (ref 4.22–5.81)
RDW: 13.2 % (ref 11.5–15.5)
WBC: 8 10*3/uL (ref 4.0–10.5)

## 2014-08-06 LAB — POCT I-STAT, CHEM 8
BUN: 27 mg/dL — ABNORMAL HIGH (ref 6–23)
CREATININE: 2.1 mg/dL — AB (ref 0.50–1.35)
Calcium, Ion: 1.12 mmol/L — ABNORMAL LOW (ref 1.13–1.30)
Chloride: 118 mEq/L — ABNORMAL HIGH (ref 96–112)
Glucose, Bld: 159 mg/dL — ABNORMAL HIGH (ref 70–99)
HCT: 30 % — ABNORMAL LOW (ref 39.0–52.0)
HEMOGLOBIN: 10.2 g/dL — AB (ref 13.0–17.0)
Potassium: 5.1 mEq/L (ref 3.7–5.3)
SODIUM: 138 meq/L (ref 137–147)
TCO2: 22 mmol/L (ref 0–100)

## 2014-08-06 LAB — COMPREHENSIVE METABOLIC PANEL
ALBUMIN: 3.3 g/dL — AB (ref 3.5–5.2)
ALT: 10 U/L (ref 0–53)
AST: 20 U/L (ref 0–37)
Alkaline Phosphatase: 78 U/L (ref 39–117)
Anion gap: 16 — ABNORMAL HIGH (ref 5–15)
BUN: 29 mg/dL — ABNORMAL HIGH (ref 6–23)
CALCIUM: 8.1 mg/dL — AB (ref 8.4–10.5)
CO2: 18 mEq/L — ABNORMAL LOW (ref 19–32)
CREATININE: 1.94 mg/dL — AB (ref 0.50–1.35)
Chloride: 107 mEq/L (ref 96–112)
GFR calc Af Amer: 35 mL/min — ABNORMAL LOW (ref >90)
GFR calc non Af Amer: 30 mL/min — ABNORMAL LOW (ref >90)
Glucose, Bld: 154 mg/dL — ABNORMAL HIGH (ref 70–99)
Potassium: 5.5 mEq/L — ABNORMAL HIGH (ref 3.7–5.3)
SODIUM: 141 meq/L (ref 137–147)
Total Bilirubin: <0.2 mg/dL — ABNORMAL LOW (ref 0.3–1.2)
Total Protein: 6.3 g/dL (ref 6.0–8.3)

## 2014-08-06 LAB — BASIC METABOLIC PANEL
ANION GAP: 13 (ref 5–15)
ANION GAP: 13 (ref 5–15)
BUN: 29 mg/dL — ABNORMAL HIGH (ref 6–23)
BUN: 29 mg/dL — AB (ref 6–23)
CALCIUM: 8.5 mg/dL (ref 8.4–10.5)
CALCIUM: 8.5 mg/dL (ref 8.4–10.5)
CO2: 18 meq/L — AB (ref 19–32)
CO2: 21 mEq/L (ref 19–32)
Chloride: 105 mEq/L (ref 96–112)
Chloride: 107 mEq/L (ref 96–112)
Creatinine, Ser: 1.94 mg/dL — ABNORMAL HIGH (ref 0.50–1.35)
Creatinine, Ser: 1.94 mg/dL — ABNORMAL HIGH (ref 0.50–1.35)
GFR calc Af Amer: 35 mL/min — ABNORMAL LOW (ref >90)
GFR calc non Af Amer: 30 mL/min — ABNORMAL LOW (ref >90)
GFR, EST AFRICAN AMERICAN: 35 mL/min — AB (ref >90)
GFR, EST NON AFRICAN AMERICAN: 30 mL/min — AB (ref >90)
GLUCOSE: 143 mg/dL — AB (ref 70–99)
Glucose, Bld: 114 mg/dL — ABNORMAL HIGH (ref 70–99)
POTASSIUM: 5.7 meq/L — AB (ref 3.7–5.3)
Potassium: 5.8 mEq/L — ABNORMAL HIGH (ref 3.7–5.3)
Sodium: 136 mEq/L — ABNORMAL LOW (ref 137–147)
Sodium: 141 mEq/L (ref 137–147)

## 2014-08-06 LAB — PROTIME-INR
INR: 7.11 (ref 0.00–1.49)
INR: 1.49 (ref 0.00–1.49)
PROTHROMBIN TIME: 61.1 s — AB (ref 11.6–15.2)
Prothrombin Time: 18 seconds — ABNORMAL HIGH (ref 11.6–15.2)

## 2014-08-06 LAB — POCT ACTIVATED CLOTTING TIME
ACTIVATED CLOTTING TIME: 180 s
ACTIVATED CLOTTING TIME: 439 s

## 2014-08-06 LAB — LIPID PANEL
Cholesterol: 182 mg/dL (ref 0–200)
HDL: 39 mg/dL — AB (ref >39)
LDL Cholesterol: 107 mg/dL — ABNORMAL HIGH (ref 0–99)
TRIGLYCERIDES: 181 mg/dL — AB (ref <150)
Total CHOL/HDL Ratio: 4.7 RATIO
VLDL: 36 mg/dL (ref 0–40)

## 2014-08-06 LAB — HEMOGLOBIN A1C
Hgb A1c MFr Bld: 6 % — ABNORMAL HIGH (ref <5.7)
MEAN PLASMA GLUCOSE: 126 mg/dL — AB (ref <117)

## 2014-08-06 LAB — APTT: aPTT: >200 seconds (ref 24–37)

## 2014-08-06 LAB — CBC
HCT: 27.9 % — ABNORMAL LOW (ref 39.0–52.0)
Hemoglobin: 9.5 g/dL — ABNORMAL LOW (ref 13.0–17.0)
MCH: 30.3 pg (ref 26.0–34.0)
MCHC: 34.1 g/dL (ref 30.0–36.0)
MCV: 88.9 fL (ref 78.0–100.0)
PLATELETS: 228 10*3/uL (ref 150–400)
RBC: 3.14 MIL/uL — AB (ref 4.22–5.81)
RDW: 13.2 % (ref 11.5–15.5)
WBC: 6.5 10*3/uL (ref 4.0–10.5)

## 2014-08-06 LAB — CK TOTAL AND CKMB (NOT AT ARMC)
CK, MB: 1.7 ng/mL (ref 0.3–4.0)
RELATIVE INDEX: INVALID (ref 0.0–2.5)
Total CK: 85 U/L (ref 7–232)

## 2014-08-06 LAB — MRSA PCR SCREENING: MRSA BY PCR: NEGATIVE

## 2014-08-06 LAB — TROPONIN I
TROPONIN I: 0.58 ng/mL — AB (ref <0.30)
Troponin I: 2.43 ng/mL (ref <0.30)

## 2014-08-06 LAB — TSH: TSH: 0.884 u[IU]/mL (ref 0.350–4.500)

## 2014-08-06 SURGERY — LEFT HEART CATHETERIZATION WITH CORONARY ANGIOGRAM
Anesthesia: LOCAL

## 2014-08-06 MED ORDER — ACETAMINOPHEN 325 MG PO TABS
650.0000 mg | ORAL_TABLET | ORAL | Status: DC | PRN
  Filled 2014-08-06: qty 2

## 2014-08-06 MED ORDER — TICAGRELOR 90 MG PO TABS
90.0000 mg | ORAL_TABLET | Freq: Two times a day (BID) | ORAL | Status: DC
  Administered 2014-08-06 – 2014-08-10 (×8): 90 mg via ORAL
  Filled 2014-08-06 (×9): qty 1

## 2014-08-06 MED ORDER — NITROGLYCERIN 0.4 MG SL SUBL
0.4000 mg | SUBLINGUAL_TABLET | SUBLINGUAL | Status: DC | PRN
Start: 2014-08-06 — End: 2014-08-10
  Filled 2014-08-06: qty 25

## 2014-08-06 MED ORDER — SODIUM CHLORIDE 0.9 % IV SOLN
INTRAVENOUS | Status: AC
  Administered 2014-08-06: 17:00:00 via INTRAVENOUS

## 2014-08-06 MED ORDER — ASPIRIN EC 81 MG PO TBEC
81.0000 mg | DELAYED_RELEASE_TABLET | Freq: Every day | ORAL | Status: DC
  Administered 2014-08-07 – 2014-08-10 (×4): 81 mg via ORAL
  Filled 2014-08-06 (×4): qty 1

## 2014-08-06 MED ORDER — HEPARIN SODIUM (PORCINE) 1000 UNIT/ML IJ SOLN
INTRAMUSCULAR | Status: AC
  Filled 2014-08-06: qty 1

## 2014-08-06 MED ORDER — MORPHINE SULFATE 2 MG/ML IJ SOLN: 2.0000 mg | INTRAMUSCULAR | Status: DC | PRN

## 2014-08-06 MED ORDER — VERAPAMIL HCL 2.5 MG/ML IV SOLN
INTRAVENOUS | Status: AC
  Filled 2014-08-06: qty 2

## 2014-08-06 MED ORDER — NITROGLYCERIN 1 MG/10 ML FOR IR/CATH LAB
INTRA_ARTERIAL | Status: AC
  Filled 2014-08-06: qty 10

## 2014-08-06 MED ORDER — LIDOCAINE HCL (PF) 1 % IJ SOLN
INTRAMUSCULAR | Status: AC
  Filled 2014-08-06: qty 30

## 2014-08-06 MED ORDER — BIVALIRUDIN 250 MG IV SOLR
INTRAVENOUS | Status: AC
  Filled 2014-08-06: qty 250

## 2014-08-06 MED ORDER — ONDANSETRON HCL 4 MG/2ML IJ SOLN: 4.0000 mg | Freq: Four times a day (QID) | INTRAMUSCULAR | Status: DC | PRN

## 2014-08-06 MED ORDER — HEPARIN (PORCINE) IN NACL 2-0.9 UNIT/ML-% IJ SOLN
INTRAMUSCULAR | Status: AC
  Filled 2014-08-06: qty 1000

## 2014-08-06 MED ORDER — TAMSULOSIN HCL 0.4 MG PO CAPS
0.4000 mg | ORAL_CAPSULE | Freq: Every day | ORAL | Status: DC
  Administered 2014-08-06 – 2014-08-10 (×5): 0.4 mg via ORAL
  Filled 2014-08-06 (×5): qty 1

## 2014-08-06 MED ORDER — PANTOPRAZOLE SODIUM 40 MG IV SOLR
40.0000 mg | INTRAVENOUS | Status: DC
  Administered 2014-08-06: 40 mg via INTRAVENOUS
  Filled 2014-08-06 (×2): qty 40

## 2014-08-06 MED ORDER — FENTANYL CITRATE 0.05 MG/ML IJ SOLN
INTRAMUSCULAR | Status: AC
  Filled 2014-08-06: qty 2

## 2014-08-06 MED ORDER — ATORVASTATIN CALCIUM 80 MG PO TABS
80.0000 mg | ORAL_TABLET | Freq: Every day | ORAL | Status: DC
  Administered 2014-08-06 – 2014-08-09 (×4): 80 mg via ORAL
  Filled 2014-08-06 (×5): qty 1

## 2014-08-06 MED ORDER — MIDAZOLAM HCL 2 MG/2ML IJ SOLN
INTRAMUSCULAR | Status: AC
  Filled 2014-08-06: qty 2

## 2014-08-06 MED ORDER — TICAGRELOR 90 MG PO TABS
ORAL_TABLET | ORAL | Status: AC
  Filled 2014-08-06: qty 2

## 2014-08-06 MED ORDER — ENOXAPARIN SODIUM 30 MG/0.3ML ~~LOC~~ SOLN
30.0000 mg | SUBCUTANEOUS | Status: DC
  Filled 2014-08-06: qty 0.3

## 2014-08-06 NOTE — CV Procedure (Signed)
Cardiac Catheterization Procedure Note  Name: Nissim CHERON PASQUARELLI MRN: [id number] DOB: Oct 05, 1927  Procedure: Left Heart Cath, Selective Coronary Angiography, LV angiography,  PTCA/Stent of the distal RCA  Indication: 80 yo [place] male presents with an acute inferior STEMI. The patient developed Ventricular fibrillation when he was initially placed on the cath table and was successfully defibrillated x 1.    Diagnostic Procedure Details: Initially the right wrist was prepped and draped Dontavis a sterile fashion. The wrist was anesthetized with 1% lidocaine. Using a modified Seldinger technique a 6 Fr slender sheath was placed Kengo the right radial artery. The wire was passed into the aorta but due to severe tortuosity of the subclavian and innominate artery I was unable to pass a catheter into the aorta.  The right groin was prepped, draped, and anesthetized with 1% lidocaine. Using the modified Seldinger technique, a 6 French sheath was introduced into the right femoral artery. Standard Judkins catheters were used for selective coronary angiography and left ventricular pressure measurement. I did not do a ventriculogram due to his CKD and concern over dye load.  Catheter exchanges were performed over a wire.  The diagnostic procedure was well-tolerated without immediate complications. Contrast 120 cc.   PROCEDURAL FINDINGS Hemodynamics: AO 123/62 mean 88 mm Hg LV 122/17 mm Hg  Coronary angiography: Coronary dominance: right  Left mainstem: Normal  Left anterior descending (LAD): The LAD is tortuous and moderately calcified. There is 30% proximal LAD disease. The mid LAD is diffusely diseased up to 50%.   Left circumflex (LCx): The LCx gives off a single OM branch then terminates Emani the AV groove. The first OM has a 90% proximal stenosis followed by diffuse 70-80% disease Leldon the mid vessel.   Right coronary artery (RCA): The RCA is a dominant vessel. There is heavy calcification throughout. The  proximal vessel has 30% stenosis. The distal vessel after the crux has a 99% stenosis. There is a long 50-60% stenosis Mikal the RCA farther distal. The PDA and PLOM are without significant disease.  Left ventriculography: not done  PCI Procedure Note:  Following the diagnostic procedure, the decision was made to proceed with PCI of the RCA.  Weight-based bivalirudin was given for anticoagulation. Brilinta 180 mg was given orally.  Once a therapeutic ACT was achieved, a 6 [place] FR4 guide catheter was inserted.  A prowater  coronary guidewire was used to cross the lesion.  The lesion was predilated with a 2.5 mm balloon. At this point I was unable to cross the lesion with a stent. A PT moderate support wire was place across the lesion and this was used with the prowater as a buddy wire but I was still unable to cross the lesion with a stent. The lesion was then predilated with a 3.0 mm balloon. I was then able to barely cover the lesion with a stent. The lesion was then stented with a 2.5 x 20 mm Promus stent.  The stent was postdilated with a 3.0 mm noncompliant balloon.  Following PCI, there was 0% residual stenosis and TIMI-3 flow. The disease Dezmin the distal RCA was unchanged. Since this disease was not severe I would recommend treating it medically. Final angiography confirmed an excellent result. Femoral hemostasis was achieved with Manual compression.  The right radial sheath was removed and a radial band was placed. The patient tolerated the PCI procedure well. There were no immediate procedural complications.  The patient was transferred to the post catheterization recovery area for  further monitoring. He was pain free at the end of procedure but did have continued ST elevation Nyal the inferior leads on Ecg.   PCI Data: Vessel - RCA/Segment - distal Percent Stenosis (pre)  99% TIMI-flow 2 Stent 2.5 x 20 mm Promus Percent Stenosis (post) 0% TIMI-flow (post) 3  Final Conclusions:   1. Severe 2 vessel  obstructive CAD with culprit lesion Dashiel the distal RCA. 2. Successful stenting of the distal RCA with a DES.   Recommendations: I would continue DAPT for one year. I would treat his residual disease medically. If he has refractory angina despite optimal medical therapy PCI of the OM could be considered but would be complex. At his age hopefully medical therapy will be enough. If Chez the future he needs cardiac cath the femoral approach should be used. If PCI of the RCA is needed Rogers the future I would use a more supportive guide +/- Guideliner. Will assess LV function by Echo. Will hydrate overnight and check BMET Breandan am.  Temesha Queener [place], [place]  08/06/2014, 2:44 PM

## 2014-08-06 NOTE — H&P (Signed)
     Patient ID: Fred Griffin MRN: [id number], DOB/AGE: [birth year]   Admit date: 08/06/2014   Primary Physician: Drema Pry, DO Primary Cardiologist: Dr [place] (new)  HPI: 80 y/o [place] male with no prior history of CAD, presents 08/06/14 with an acute inferior STEMI. Pt developed chest pain and SOB about an hour before admission. He called his son and EMS. EKG shows 2-3 mm inferior ST elevation and bradycardia. En route he had off and on chest pain. His B/P was borderline so NTG was not administered. He did receive 4 baby ASA. He was taken directly to the cath lab.    Problem List: Past Medical History  Diagnosis Date  . Hypertension   . Syncope 01/2010  . Prostate cancer     Followed by Dr [name] -  Urologist [facility]  . History of colonoscopy     Past Surgical History  Procedure Laterality Date  . Cataract extraction      x 2      Allergies: No Known Allergies   Home Medications No current facility-administered medications for this encounter.  Losartan 12.5 mg BID Flomax 0.4 mg daily   Family History  Problem Relation Age of Onset  . Cancer Brother     Intestinal Cancer     History   Social History  . Marital Status: Married    Spouse Name: N/A    Number of Children: N/A  . Years of Education: N/A   Occupational History  . Retired    Social History Main Topics  . Smoking status: Current Every Day Smoker -- 0.25 packs/day    Types: Cigarettes  . Smokeless tobacco: Not on file  . Alcohol Use:   . Drug Use:   . Sexual Activity:    Other Topics Concern  . Not on file   Social History Narrative   [place] up [place]    Wife lives [place], [place] with daughter)   2 sons ( 1 son Bettie [place], 1 son lives nearby)  2 daughters     Review of Systems: Not able to be obtained- pt on cath table  Physical Exam: There were no vitals taken for this visit.  General appearance: alert, cooperative, no distress and pale Neck: no carotid bruit and no JVD Lungs:  clear to auscultation bilaterally Heart: regular rate and rhythm and slow rate-50 Abdomen: soft, non-tender; bowel sounds normal; no masses,  no organomegaly Extremities: extremities normal, atraumatic, no cyanosis or edema Pulses: diminished LE pulses Skin: pale, cool, clammy Neurologic: Grossly normal    Labs:  No results found for this or any previous visit (from the past 24 hour(s)).   Radiology/Studies: No results found.  EKG:NSR, SB inferior ST elevation with lateral ST depression  ASSESSMENT AND PLAN:  Principal Problem:   STEMI (ST elevation myocardial infarction) Active Problems:   HYPERTENSION   RENAL INSUFFICIENCY- SCr 1.5 Fred [place] 2011   PROSTATE CANCER, HX OF   Renal cysts, acquired, bilateral   PLAN: Urgent cath   SignedErlene Quan, PA-C 08/06/2014, 1:44 PM  Patient seen and examined and history reviewed. Agree with above findings and plan. 80 yo [place] male with history of HTN and CKD presents with acute inferior STEMI. Emergent cardiac cath and PCI recommended.   Shaquoya Cosper [place], [place] 08/06/2014 3:12 PM

## 2014-08-06 NOTE — CV Procedure (Signed)
Removed 21fr sheath from rfa.  Hemostasis was achieved with 30 minutes of direct pressure. Area soft with soft with only slight bruising at entrance site.  Distal pulses present before and after sheath removal.  Pt teaching was done with the assistance of pts pastor who speaks his native language.  No hemartoma noted, sterile dsg applied and secured with hypobond.

## 2014-08-06 NOTE — Progress Notes (Signed)
eLink Physician-Brief Progress Note Patient Name: Fred Griffin DOB: 1927/07/08 MRN: [id number]   Date of Service  08/06/2014  HPI/Events of Note  New admit note: s/p STENT inferior MI To note had k 5.5 and crt almost 2 pre contrast Appears well, no distress, no arrytmia  eICU Interventions  Will continue to monitor Consider evaluation K pos contrast  This PM Have also spoken to Rn to meet any concerns     Intervention Category Evaluation Type: New Patient Evaluation  Raylene Miyamoto. 08/06/2014, 3:59 PM

## 2014-08-07 ENCOUNTER — Inpatient Hospital Stay (HOSPITAL_COMMUNITY): Payer: Medicare Other

## 2014-08-07 DIAGNOSIS — I10 Essential (primary) hypertension: Secondary | ICD-10-CM

## 2014-08-07 DIAGNOSIS — N183 Chronic kidney disease, stage 3 unspecified: Secondary | ICD-10-CM

## 2014-08-07 DIAGNOSIS — I2119 ST elevation (STEMI) myocardial infarction involving other coronary artery of inferior wall: Secondary | ICD-10-CM

## 2014-08-07 DIAGNOSIS — I359 Nonrheumatic aortic valve disorder, unspecified: Secondary | ICD-10-CM

## 2014-08-07 DIAGNOSIS — I2109 ST elevation (STEMI) myocardial infarction involving other coronary artery of anterior wall: Secondary | ICD-10-CM

## 2014-08-07 LAB — CBC
HCT: 27.8 % — ABNORMAL LOW (ref 39.0–52.0)
Hemoglobin: 9.3 g/dL — ABNORMAL LOW (ref 13.0–17.0)
MCH: 29.6 pg (ref 26.0–34.0)
MCHC: 33.5 g/dL (ref 30.0–36.0)
MCV: 88.5 fL (ref 78.0–100.0)
Platelets: 239 10*3/uL (ref 150–400)
RBC: 3.14 MIL/uL — ABNORMAL LOW (ref 4.22–5.81)
RDW: 13.3 % (ref 11.5–15.5)
WBC: 7 10*3/uL (ref 4.0–10.5)

## 2014-08-07 LAB — LIPID PANEL
Cholesterol: 179 mg/dL (ref 0–200)
HDL: 38 mg/dL — ABNORMAL LOW (ref >39)
LDL Cholesterol: 112 mg/dL — ABNORMAL HIGH (ref 0–99)
Total CHOL/HDL Ratio: 4.7 RATIO
Triglycerides: 144 mg/dL (ref <150)
VLDL: 29 mg/dL (ref 0–40)

## 2014-08-07 LAB — BASIC METABOLIC PANEL
ANION GAP: 11 (ref 5–15)
BUN: 28 mg/dL — ABNORMAL HIGH (ref 6–23)
CALCIUM: 8.6 mg/dL (ref 8.4–10.5)
CHLORIDE: 110 meq/L (ref 96–112)
CO2: 22 mEq/L (ref 19–32)
CREATININE: 2.02 mg/dL — AB (ref 0.50–1.35)
GFR calc non Af Amer: 29 mL/min — ABNORMAL LOW (ref >90)
GFR, EST AFRICAN AMERICAN: 33 mL/min — AB (ref >90)
Glucose, Bld: 104 mg/dL — ABNORMAL HIGH (ref 70–99)
Potassium: 5.4 mEq/L — ABNORMAL HIGH (ref 3.7–5.3)
Sodium: 143 mEq/L (ref 137–147)

## 2014-08-07 LAB — TROPONIN I: TROPONIN I: 6.6 ng/mL — AB (ref <0.30)

## 2014-08-07 MED ORDER — CARVEDILOL 3.125 MG PO TABS
3.1250 mg | ORAL_TABLET | Freq: Two times a day (BID) | ORAL | Status: DC
  Administered 2014-08-07 – 2014-08-09 (×6): 3.125 mg via ORAL
  Filled 2014-08-07 (×9): qty 1

## 2014-08-07 MED ORDER — PANTOPRAZOLE SODIUM 40 MG PO TBEC
40.0000 mg | DELAYED_RELEASE_TABLET | Freq: Every day | ORAL | Status: DC
  Administered 2014-08-07 – 2014-08-09 (×3): 40 mg via ORAL
  Filled 2014-08-07 (×3): qty 1

## 2014-08-07 MED FILL — Sodium Chloride IV Soln 0.9%: INTRAVENOUS | Qty: 50 | Status: AC

## 2014-08-07 NOTE — Progress Notes (Signed)
PROGRESS NOTE  Subjective:   80 yo asian man - admitted with Inf. STEMI Has PCI of RCA.  Had slightly sluggish flow - even at the end of the case. No interpreter available.  Communicated with basic hand gestures.  He seems to be doing OK.  No CP . Breathing is OK.  He said he is "good".    Objective:    Vital Signs:   Temp:  [97.2 F (36.2 C)-98.3 F (36.8 C)] 98.1 F (36.7 C) (09/25 0900) Pulse Rate:  [56-82] 76 (09/25 0600) Resp:  [12-31] 18 (09/25 0600) BP: (123-168)/(52-95) 141/62 mmHg (09/25 0600) SpO2:  [98 %-100 %] 99 % (09/25 0600) Weight:  [149 lb 0.5 oz (67.6 kg)-151 lb 3.8 oz (68.6 kg)] 149 lb 0.5 oz (67.6 kg) (09/25 0300)      24-hour weight change: Weight change:   Weight trends: Filed Weights   08/06/14 1630 08/07/14 0300  Weight: 151 lb 3.8 oz (68.6 kg) 149 lb 0.5 oz (67.6 kg)    Intake/Output:  09/24 0701 - 09/25 0700 [place]: 857.5 [P.O.:120; I.V.:737.5] Out: 426 [Urine:425; Stool:1] Total I/O [place]: 320 [P.O.:320] Out: 50 [Urine:50]   Physical Exam: BP 141/62  Pulse 76  Temp(Src) 98.1 F (36.7 C) (Oral)  Resp 18  Ht 6' (1.829 m)  Wt 149 lb 0.5 oz (67.6 kg)  BMI 20.21 kg/m2  SpO2 99%  Wt Readings from Last 3 Encounters:  08/07/14 149 lb 0.5 oz (67.6 kg)  08/07/14 149 lb 0.5 oz (67.6 kg)  02/01/11 166 lb (75.297 kg)    General: Vital signs reviewed and noted.   Head: Normocephalic, atraumatic.  Eyes: conjunctivae/corneas clear.  EOM's intact.   Throat: normal  Neck:  normal   Lungs:    slightly reduced breath sounds on left base   Heart:  rr, normal S1S2  Abdomen:  Soft, non-tender, non-distended    Extremities: Right radial cath site looks ok   Neurologic: A&O X3, CN II - XII are grossly intact.   Psych: Normal     Labs: BMET:  Recent Labs  08/06/14 2120 08/07/14 0333  NA 141 143  K 5.8* 5.4*  CL 107 110  CO2 21 22  GLUCOSE 114* 104*  BUN 29* 28*  CREATININE 1.94* 2.02*  CALCIUM 8.5 8.6    Liver function  tests:  Recent Labs  08/06/14 1402  AST 20  ALT 10  ALKPHOS 78  BILITOT <0.2*  PROT 6.3  ALBUMIN 3.3*   No results found for this basename: LIPASE, AMYLASE,  Cahlil the last 72 hours  CBC:  Recent Labs  08/06/14 1355  08/06/14 1402 08/07/14 0333  WBC 8.0  --  6.5 7.0  NEUTROABS 7.1  --   --   --   HGB 9.7*  < > 9.5* 9.3*  HCT 28.9*  < > 27.9* 27.8*  MCV 90.9  --  88.9 88.5  PLT 233  --  228 239  < > = values Tyshaun this interval not displayed.  Cardiac Enzymes:  Recent Labs  08/06/14 1402 08/06/14 1558 08/06/14 2120 08/07/14 0333  CKTOTAL 85  --   --   --   CKMB 1.7  --   --   --   TROPONINI <0.30 0.58* 2.43* 6.60*    Coagulation Studies:  Recent Labs  08/06/14 1355 08/06/14 1402  LABPROT 18.0* 61.1*  INR 1.49 7.11*    Other: No components found with this basename: POCBNP,  No results found  for this basename: DDIMER,  Yael the last 72 hours  Recent Labs  08/06/14 1402  HGBA1C 6.0*    Recent Labs  08/07/14 0333  CHOL 179  HDL 38*  LDLCALC 112*  TRIG 144  CHOLHDL 4.7    Recent Labs  08/06/14 1430  TSH 0.884   No results found for this basename: VITAMINB12, FOLATE, FERRITIN, TIBC, IRON, RETICCTPCT,  [place]mar[place] the last 72 hours   Other results:  EKG :  NSR, Inf. Q waves with ST changes c/w recent Inf. MI  Medications:    Infusions:    Scheduled Medications: . aspirin EC  81 mg Oral Daily  . atorvastatin  80 mg Oral q1800  . carvedilol  3.125 mg Oral BID WC  . pantoprazole (PROTONIX) IV  40 mg Intravenous Q24H  . tamsulosin  0.4 mg Oral Daily  . ticagrelor  90 mg Oral BID    Assessment/ Plan:   Principal Problem:   STEMI (ST elevation myocardial infarction) Active Problems:   HYPERTENSION   PROSTATE CANCER, HX OF   RENAL INSUFFICIENCY- SCr 1.5 Lionardo 2011   Renal cysts, acquired, bilateral   ST elevation myocardial infarction (STEMI) involving right coronary artery [place] recovery phase  1. Inf. Mi:  He seems to be doing well this  am. Will transfer to step down  2. Chronic renal insufficiency:   Baseline creatinine is around 2.  Will hold Lisinopril today ( post cath)    3. HTN:  BP is a bit elevated.  Will start Coreg 3.125 bid    Disposition:   Transfer to step down.   Length of Stay: 1  Thayer Headings, Brooke Bonito., MD, [facility] 08/07/2014, 9:35 AM Office [phone] Pager [phone]

## 2014-08-07 NOTE — Care Management Note (Addendum)
    Page 1 of 1   08/10/2014     2:14:22 PM CARE MANAGEMENT NOTE 08/10/2014  Patient:  Fred Griffin, Fred Griffin   Account Number:  [id number]  Date Initiated:  08/06/2014  Documentation initiated by:  Elissa Hefty  Subjective/Objective Assessment:   adm w mi     Action/Plan:   lives w wife, pcp dr [name]   Anticipated DC Date:     Anticipated DC Plan:        DC Planning Services  CM consult  Medication Assistance      Choice offered to / List presented to:             Status of service:  Completed, signed off Medicare Important Message given?   (If response is "NO", the following Medicare IM given date fields will be blank) Date Medicare IM given:   Medicare IM given by:   Date Additional Medicare IM given:   Additional Medicare IM given by:    Discharge Disposition:  HOME/SELF CARE  Per UR Regulation:  Reviewed for med. necessity/level of care/duration of stay  If discussed at [place] of Stay Meetings, dates discussed:    Comments:  08/10/14- [place] RN, BSN [phone] Attempted to give Medicare IM- pt does not speak english- family on their way that speak [place]- bedside RN to let CM know when family arrives.   9/25 1003a debbie dowell rn,bsn left pt 30day free brilinta card and brilinta drug information.

## 2014-08-07 NOTE — Progress Notes (Signed)
CARDIAC REHAB PHASE I   PRE:  Rate/Rhythm: 76 SR    BP: sitting 145/65    SaO2: 94 RA  MODE:  Ambulation: 270 ft   POST:  Rate/Rhythm: 103 ST    BP: sitting 143/86     SaO2: 99 RA  Pt SOB with talking, although not as noticeable with walking. Also noticed episodic breathlessness-? R/t Brilinta. Pt sts through interpreter that he thinks he is SOB due to PNA. Very difficult communication. Got interpreter on phone to help and also do education however pt was more focused on getting his denture glue and eye drops from home. I was able to convey his need for Brilinta and showed him his stent card. Left MI book, stent card, diet, smoking cessation sheet, ex gl and CRPII brochure for his son. Will f/u to ambulate tomorrow. Pt laid down however looked uncomfortable. ? Reflux from eating his salad too quickly [id number]  Darrick Meigs CES, ACSM 08/07/2014 2:45 PM

## 2014-08-07 NOTE — Progress Notes (Signed)
  Echocardiogram 2D Echocardiogram has been performed.  Fred Griffin 08/07/2014, 11:09 AM

## 2014-08-08 DIAGNOSIS — E875 Hyperkalemia: Secondary | ICD-10-CM | POA: Diagnosis present

## 2014-08-08 LAB — PROTIME-INR
INR: 1.05 (ref 0.00–1.49)
PROTHROMBIN TIME: 13.7 s (ref 11.6–15.2)

## 2014-08-08 LAB — BASIC METABOLIC PANEL
Anion gap: 12 (ref 5–15)
BUN: 29 mg/dL — ABNORMAL HIGH (ref 6–23)
CO2: 21 meq/L (ref 19–32)
Calcium: 9.2 mg/dL (ref 8.4–10.5)
Chloride: 107 mEq/L (ref 96–112)
Creatinine, Ser: 2.08 mg/dL — ABNORMAL HIGH (ref 0.50–1.35)
GFR calc Af Amer: 32 mL/min — ABNORMAL LOW (ref >90)
GFR calc non Af Amer: 28 mL/min — ABNORMAL LOW (ref >90)
GLUCOSE: 118 mg/dL — AB (ref 70–99)
POTASSIUM: 5.6 meq/L — AB (ref 3.7–5.3)
SODIUM: 140 meq/L (ref 137–147)

## 2014-08-08 MED ORDER — TRAZODONE 25 MG HALF TABLET
25.0000 mg | ORAL_TABLET | Freq: Every evening | ORAL | Status: DC | PRN
  Filled 2014-08-08: qty 1

## 2014-08-08 MED ORDER — ENOXAPARIN SODIUM 30 MG/0.3ML ~~LOC~~ SOLN
30.0000 mg | SUBCUTANEOUS | Status: DC
  Administered 2014-08-08 – 2014-08-09 (×2): 30 mg via SUBCUTANEOUS
  Filled 2014-08-08 (×4): qty 0.3

## 2014-08-08 MED ORDER — SODIUM POLYSTYRENE SULFONATE 15 GM/60ML PO SUSP
30.0000 g | Freq: Once | ORAL | Status: AC
Start: 2014-08-08 — End: 2014-08-08
  Administered 2014-08-08: 30 g via ORAL
  Filled 2014-08-08: qty 120

## 2014-08-08 NOTE — Progress Notes (Signed)
Patient Name: Fred Griffin: 80 yo [place] male admitted 9/24 with inferior STEMI>>cath 2 V  CAD with stenting-DES of distal RCA with residual OM-1 90% lesion for which medical therapy was recommended  Hospital course complicated by language and hyperkalemia and renal insufficiency; was on losartan as an outpatient  I spoke to him through an interpreter. He denied chest pain or shortness of breath although he did have an episode of chest discomfort that lasted less than 1 minute earlier this morning.  Past Medical History  Diagnosis Date  . Hypertension   . Syncope 01/2010  . Prostate cancer     Followed by Dr [name] -  Urologist [facility]  . History of colonoscopy     Scheduled Meds:  Scheduled Meds: . aspirin EC  81 mg Oral Daily  . atorvastatin  80 mg Oral q1800  . carvedilol  3.125 mg Oral BID WC  . pantoprazole  40 mg Oral q1800  . tamsulosin  0.4 mg Oral Daily  . ticagrelor  90 mg Oral BID   Continuous Infusions:  acetaminophen, morphine injection, nitroGLYCERIN, ondansetron (ZOFRAN) IV, traZODone    PHYSICAL EXAM Filed Vitals:   08/08/14 0531 08/08/14 0533 08/08/14 0600 08/08/14 0750  BP:    152/65  Pulse: 71 69 61 75  Temp:    98.4 F (36.9 C)  TempSrc:    Oral  Resp: 27 24 13 19   Height:      Weight:      SpO2: 98% 99% 100% 98%  Well developed and nourished Juergen no acute distress HENT normal Neck supple with JVP-flat Clear Regular rate and rhythm, no murmurs or gallops Abd-soft with active BS No Clubbing cyanosis edema Skin-warm and dry A & Oriented  Grossly normal sensory and motor function   TELEMETRY: Reviewed telemetry pt Bennet sinus:    Intake/Output Summary (Last 24 hours) at 08/08/14 1127 Last data filed at 08/08/14 0430  Gross per 24 hour  Intake    250 ml  Output    450 ml  Net   -200 ml    LABS: Basic Metabolic Panel:  Recent Labs Lab 08/06/14 1355 08/06/14 1400 08/06/14 1402 08/06/14 2120  08/07/14 0333  NA 136* 138 141 141 143  K 5.7* 5.1 5.5* 5.8* 5.4*  CL 105 118* 107 107 110  CO2 18*  --  18* 21 22  GLUCOSE 143* 159* 154* 114* 104*  BUN 29* 27* 29* 29* 28*  CREATININE 1.94* 2.10* 1.94* 1.94* 2.02*  CALCIUM 8.5  --  8.1* 8.5 8.6   Cardiac Enzymes:  Recent Labs  08/06/14 1402 08/06/14 1558 08/06/14 2120 08/07/14 0333  CKTOTAL 85  --   --   --   CKMB 1.7  --   --   --   TROPONINI <0.30 0.58* 2.43* 6.60*   CBC:  Recent Labs Lab 08/06/14 1355 08/06/14 1400 08/06/14 1402 08/07/14 0333  WBC 8.0  --  6.5 7.0  NEUTROABS 7.1  --   --   --   HGB 9.7* 10.2* 9.5* 9.3*  HCT 28.9* 30.0* 27.9* 27.8*  MCV 90.9  --  88.9 88.5  PLT 233  --  228 239   PROTIME:  Recent Labs  08/06/14 1355 08/06/14 1402  LABPROT 18.0* 61.1*  INR 1.49 7.11*   Liver Function Tests:  Recent Labs  08/06/14 1402  AST 20  ALT 10  ALKPHOS 78  BILITOT <0.2*  PROT 6.3  ALBUMIN 3.3*   No results found for this basename: LIPASE, AMYLASE,  Satya the last 72 hours BNP: BNP (last 3 results) No results found for this basename: PROBNP,  Kire the last 8760 hours D-Dimer: No results found for this basename: DDIMER,  Elza the last 72 hours Hemoglobin A1C:  Recent Labs  08/06/14 1402  HGBA1C 6.0*   Fasting Lipid Panel:  Recent Labs  08/07/14 0333  CHOL 179  HDL 38*  LDLCALC 112*  TRIG 144  CHOLHDL 4.7   Thyroid Function Tests:  Recent Labs  08/06/14 1430  TSH 0.884   Anemia Panel: No results found for this basename: VITAMINB12, FOLATE, FERRITIN, TIBC, IRON, RETICCTPCT,  [place]land the last 72 hours      ASSESSMENT AND PLAN:  Active Problems:   HYPERTENSION   PROSTATE CANCER, HX OF   Chronic renal insufficiency, stage IV (severe)   Renal cysts, acquired, bilateral   ST elevation myocardial infarction (STEMI) involving right coronary artery [place] recovery phase   Hyperkalemia  He is stable post MI.  I'm concerned about his hyperkalemia; we will keep a close eye on it.  His creatinine is increasing; this may be some component of contrast-induced nephropathy.  I will check with nephrology to see if there is anything specifically that we need to do at this juncture as his creatinine has likely not reached its [place].  Will begin to ambulate   Signed, Virl Axe MD  08/08/2014

## 2014-08-08 NOTE — Progress Notes (Signed)
Pt c/o tightness Zadok chest when breathing at 0530. Denied cp. Lungs sounded clear to auscultation, O2 sat 100% on room air, RR Brannon the low 20's, HR 68. He was laying flat at the time. He was slid up [place] bed and the [facility] raised to semi-fowlers position. Spoke with Dr. Colon Flattery who recommended O2 via nasal cannula at 2L. Patient then stated he was feeling better with O2 sat 100%, RR 13, HR 65

## 2014-08-08 NOTE — Progress Notes (Signed)
CARDIAC REHAB PHASE I   PRE:  Rate/Rhythm: 22 SR  BP:  Sitting: 150/91     SaO2: 100 RA  MODE:  Ambulation: 350 ft   POST:  Rate/Rhythm: 97 SR  BP:  Sitting: 140/68    SaO2: 100 RA  Interpreter was contacted to confirm ambulation.  Pt walked 350 ft independently.  Pt had good/steady gait with no c/o during ambulation.  Returned pt to recliner with call bell and phone Rakan reach, as well as his belongings.  Pt did not have any questions at this time. [id number]  Lillia Dallas MS, ACSM RCEP 10:52 AM 08/08/2014

## 2014-08-08 NOTE — Progress Notes (Signed)
Spoke with nephology  Will give kayexalate 30

## 2014-08-09 LAB — BASIC METABOLIC PANEL
ANION GAP: 14 (ref 5–15)
BUN: 30 mg/dL — ABNORMAL HIGH (ref 6–23)
CHLORIDE: 104 meq/L (ref 96–112)
CO2: 24 mEq/L (ref 19–32)
Calcium: 9.2 mg/dL (ref 8.4–10.5)
Creatinine, Ser: 2.25 mg/dL — ABNORMAL HIGH (ref 0.50–1.35)
GFR calc non Af Amer: 25 mL/min — ABNORMAL LOW (ref >90)
GFR, EST AFRICAN AMERICAN: 29 mL/min — AB (ref >90)
Glucose, Bld: 119 mg/dL — ABNORMAL HIGH (ref 70–99)
Potassium: 5.1 mEq/L (ref 3.7–5.3)
Sodium: 142 mEq/L (ref 137–147)

## 2014-08-09 NOTE — Progress Notes (Signed)
  K down to 5.1  Will follow

## 2014-08-09 NOTE — Progress Notes (Signed)
Patient Name: Fred Griffin: 80 yo [place] male admitted 9/24 with inferior STEMI>>cath 2 V  CAD with stenting-DES of distal RCA with residual OM-1 90% lesion for which medical therapy was recommended  Hospital course complicated by language and hyperkalemia and renal insufficiency; was on losartan as an outpatient  I spoke to him through an interpreter. He denied chest pain or shortness of breath  Sitting yup and walked some yesterday Past Medical History  Diagnosis Date  . Hypertension   . Syncope 01/2010  . Prostate cancer     Followed by Dr Fred Griffin -  Urologist Fred Griffin [facility]  . History of colonoscopy     Scheduled Meds:  Scheduled Meds: . aspirin EC  81 mg Oral Daily  . atorvastatin  80 mg Oral q1800  . carvedilol  3.125 mg Oral BID WC  . enoxaparin (LOVENOX) injection  30 mg Subcutaneous Q24H  . pantoprazole  40 mg Oral q1800  . tamsulosin  0.4 mg Oral Daily  . ticagrelor  90 mg Oral BID   Continuous Infusions:  acetaminophen, morphine injection, nitroGLYCERIN, ondansetron (ZOFRAN) IV, traZODone    PHYSICAL EXAM Filed Vitals:   08/09/14 0000 08/09/14 0340 08/09/14 0400 08/09/14 0747  BP:  151/65  148/80  Pulse:  68 66 75  Temp:  97.6 F (36.4 C)  97.9 F (36.6 C)  TempSrc:  Oral  Oral  Resp: 18 16 25 16   Height:  6' (1.829 m)    Weight:  145 lb 11.6 oz (66.1 kg)    SpO2:  100% 99% 99%  Well developed and nourished Fred Griffin no acute distress HENT normal Neck supple with JVP-flat Clear Regular rate and rhythm, no murmurs or gallops Abd-soft with active BS No Clubbing cyanosis edema Skin-warm and dry A & Oriented  Grossly normal sensory and motor function   TELEMETRY: Reviewed telemetry pt Fred Griffin sinus:    Intake/Output Summary (Last 24 hours) at 08/09/14 0932 Last data filed at 08/09/14 0000  Gross per 24 hour  Intake    250 ml  Output    470 ml  Net   -220 ml    LABS: Basic Metabolic Panel:  Recent Labs Lab 08/06/14 1355  08/06/14 1400 08/06/14 1402 08/06/14 2120 08/07/14 0333 08/08/14 1025  NA 136* 138 141 141 143 140  K 5.7* 5.1 5.5* 5.8* 5.4* 5.6*  CL 105 118* 107 107 110 107  CO2 18*  --  18* 21 22 21   GLUCOSE 143* 159* 154* 114* 104* 118*  BUN 29* 27* 29* 29* 28* 29*  CREATININE 1.94* 2.10* 1.94* 1.94* 2.02* 2.08*  CALCIUM 8.5  --  8.1* 8.5 8.6 9.2   Cardiac Enzymes:  Recent Labs  08/06/14 1402 08/06/14 1558 08/06/14 2120 08/07/14 0333  CKTOTAL 85  --   --   --   CKMB 1.7  --   --   --   TROPONINI <0.30 0.58* 2.43* 6.60*   CBC:  Recent Labs Lab 08/06/14 1355 08/06/14 1400 08/06/14 1402 08/07/14 0333  WBC 8.0  --  6.5 7.0  NEUTROABS 7.1  --   --   --   HGB 9.7* 10.2* 9.5* 9.3*  HCT 28.9* 30.0* 27.9* 27.8*  MCV 90.9  --  88.9 88.5  PLT 233  --  228 239   PROTIME:  Recent Labs  08/06/14 1355 08/06/14 1402 08/08/14 1025  LABPROT 18.0* 61.1* 13.7  INR 1.49 7.11*  1.05   Liver Function Tests:  Recent Labs  08/06/14 1402  AST 20  ALT 10  ALKPHOS 31  BILITOT <0.2*  PROT 6.3  ALBUMIN 3.3*   No results found for this basename: LIPASE, AMYLASE,  Fred Griffin the last 72 hours BNP: BNP (last 3 results) No results found for this basename: PROBNP,  Fred Griffin the last 8760 hours D-Dimer: No results found for this basename: DDIMER,  Fred Griffin the last 72 hours Hemoglobin A1C:  Recent Labs  08/06/14 1402  HGBA1C 6.0*   Fasting Lipid Panel:  Recent Labs  08/07/14 0333  CHOL 179  HDL 38*  LDLCALC 112*  TRIG 144  CHOLHDL 4.7   Thyroid Function Tests:  Recent Labs  08/06/14 1430  TSH 0.884   Anemia Panel: No results found for this basename: VITAMINB12, FOLATE, FERRITIN, TIBC, IRON, RETICCTPCT,  Fred Griffin the last 72 hours      ASSESSMENT AND PLAN:  Active Problems:   HYPERTENSION   PROSTATE CANCER, HX OF   Chronic renal insufficiency, stage IV (severe)   Renal cysts, acquired, bilateral   ST elevation myocardial infarction (STEMI) involving right coronary artery Fred Griffin recovery  phase   Hyperkalemia  He is stable post MI. Gave kyaexalate yesterday, but forgot to write for BMET this am Will check now  Will continue to ambulate   Signed, Fred Axe MD  08/09/2014

## 2014-08-10 ENCOUNTER — Telehealth: Payer: Self-pay | Admitting: Cardiology

## 2014-08-10 ENCOUNTER — Encounter (HOSPITAL_COMMUNITY): Payer: Self-pay | Admitting: Physician Assistant

## 2014-08-10 ENCOUNTER — Other Ambulatory Visit: Payer: Self-pay | Admitting: Physician Assistant

## 2014-08-10 DIAGNOSIS — N184 Chronic kidney disease, stage 4 (severe): Secondary | ICD-10-CM

## 2014-08-10 DIAGNOSIS — E875 Hyperkalemia: Secondary | ICD-10-CM

## 2014-08-10 DIAGNOSIS — D649 Anemia, unspecified: Secondary | ICD-10-CM

## 2014-08-10 LAB — BASIC METABOLIC PANEL
ANION GAP: 14 (ref 5–15)
BUN: 31 mg/dL — ABNORMAL HIGH (ref 6–23)
CHLORIDE: 106 meq/L (ref 96–112)
CO2: 20 mEq/L (ref 19–32)
CREATININE: 2.11 mg/dL — AB (ref 0.50–1.35)
Calcium: 8.7 mg/dL (ref 8.4–10.5)
GFR calc non Af Amer: 27 mL/min — ABNORMAL LOW (ref >90)
GFR, EST AFRICAN AMERICAN: 31 mL/min — AB (ref >90)
Glucose, Bld: 104 mg/dL — ABNORMAL HIGH (ref 70–99)
POTASSIUM: 4.4 meq/L (ref 3.7–5.3)
Sodium: 140 mEq/L (ref 137–147)

## 2014-08-10 MED ORDER — ATORVASTATIN CALCIUM 80 MG PO TABS: 80.0000 mg | ORAL_TABLET | Freq: Every day | ORAL | Status: DC

## 2014-08-10 MED ORDER — TICAGRELOR 90 MG PO TABS: 90.0000 mg | ORAL_TABLET | Freq: Two times a day (BID) | ORAL | Status: DC

## 2014-08-10 MED ORDER — TICAGRELOR 90 MG PO TABS
90.0000 mg | ORAL_TABLET | Freq: Two times a day (BID) | ORAL | Status: DC
Start: 2014-08-10 — End: 2014-08-10

## 2014-08-10 MED ORDER — NITROGLYCERIN 0.4 MG SL SUBL: 0.4000 mg | SUBLINGUAL_TABLET | SUBLINGUAL | Status: DC | PRN

## 2014-08-10 MED ORDER — ASPIRIN 81 MG PO TBEC: 81.0000 mg | DELAYED_RELEASE_TABLET | Freq: Every day | ORAL | Status: AC

## 2014-08-10 MED ORDER — PANTOPRAZOLE SODIUM 40 MG PO TBEC: 40.0000 mg | DELAYED_RELEASE_TABLET | Freq: Every day | ORAL | Status: DC

## 2014-08-10 MED ORDER — CARVEDILOL 6.25 MG PO TABS
6.2500 mg | ORAL_TABLET | Freq: Two times a day (BID) | ORAL | Status: DC
  Administered 2014-08-10: 6.25 mg via ORAL
  Filled 2014-08-10 (×3): qty 1

## 2014-08-10 MED ORDER — CARVEDILOL 6.25 MG PO TABS: 6.2500 mg | ORAL_TABLET | Freq: Two times a day (BID) | ORAL | Status: AC

## 2014-08-10 NOTE — Progress Notes (Signed)
Personally seen and examined. Agree with above. DC home.  Candee Furbish, MD

## 2014-08-10 NOTE — Telephone Encounter (Signed)
Returned call to patient's nurse Suanne Marker post hospital appointment scheduled with Dr.Jordan 08/26/14 at 12:00 noon.

## 2014-08-10 NOTE — Progress Notes (Signed)
Patient Name: Fred Griffin Date of Encounter: 08/10/2014  Active Problems:   ST elevation myocardial infarction (STEMI) involving right coronary artery Fred Griffin recovery phase   HYPERTENSION   PROSTATE CANCER, HX OF   Chronic renal insufficiency, stage IV (severe)   Renal cysts, acquired, bilateral   Hyperkalemia    Patient Profile: 80 yo Fred Griffin male admitted 9/24 with inferior STEMI>>cath 2 V CAD with stenting-DES of distal RCA with residual OM-1 90% lesion for which medical therapy was recommended. Renal insuff & hyper-K+ complicated care   SUBJECTIVE: Through interpreter phone: no chest pain, no SOB, ambulating well. Feels his body does not work as well due to aging, no specific complaints. Feels he could go home today.  OBJECTIVE Filed Vitals:   08/09/14 1053 08/09/14 1351 08/09/14 2100 08/10/14 0500  BP: 162/78 126/65 139/70 151/77  Pulse: 77 72 64 70  Temp: 98 F (36.7 C) 97.9 F (36.6 C) 97.8 F (36.6 C) 98 F (36.7 C)  TempSrc: Oral Oral    Resp: 15 18 12 12   Height:      Weight:    147 lb 0.8 oz (66.7 kg)  SpO2: 100% 100% 100% 100%    Intake/Output Summary (Last 24 hours) at 08/10/14 0717 Last data filed at 08/10/14 0500  Gross per 24 hour  Intake      0 ml  Output    300 ml  Net   -300 ml   Filed Weights   08/08/14 0500 08/09/14 0340 08/10/14 0500  Weight: 151 lb 14.4 oz (68.9 kg) 145 lb 11.6 oz (66.1 kg) 147 lb 0.8 oz (66.7 kg)    PHYSICAL EXAM General: Well developed, well nourished, male Fred Griffin no acute distress. Head: Normocephalic, atraumatic.  Neck: Supple without bruits, JVD not elevated. Lungs:  Resp regular and unlabored, CTA. Heart: RRR, S1, S2, no S3, S4, or murmur; no rub. Abdomen: Soft, non-tender, non-distended, BS + x 4.  Extremities: No clubbing, cyanosis, no edema. Ecchymosis at right radial cath site, no hematoma Neuro: Alert and oriented X 3. Moves all extremities spontaneously. Psych: Normal affect.  LABS: INR:  Recent Labs  08/08/14 1025  INR 5.40   Basic Metabolic Panel:  Recent Labs  08/09/14 1125 08/10/14 0445  NA 142 140  K 5.1 4.4  CL 104 106  CO2 24 20  GLUCOSE 119* 104*  BUN 30* 31*  CREATININE 2.25* 2.11*  CALCIUM 9.2 8.7   Lab Results  Component Value Date   CHOL 179 08/07/2014   HDL 38* 08/07/2014   LDLCALC 112* 08/07/2014   TRIG 144 08/07/2014   CHOLHDL 4.7 08/07/2014   TELE:   SR, ST, no sig. ectopy     Radiology/Studies:  No results found.   Current Medications:  . aspirin EC  81 mg Oral Daily  . atorvastatin  80 mg Oral q1800  . carvedilol  3.125 mg Oral BID WC  . enoxaparin (LOVENOX) injection  30 mg Subcutaneous Q24H  . pantoprazole  40 mg Oral q1800  . tamsulosin  0.4 mg Oral Daily  . ticagrelor  90 mg Oral BID      ASSESSMENT AND PLAN: Active Problems:   ST elevation myocardial infarction (STEMI) involving right coronary artery Fred Griffin recovery phase - on ASA, BB, statin, DAPT.     HYPERTENSION - SBP 110s-160s on current rx, increase Coreg 3.125-->6.25 mg BID    PROSTATE CANCER, HX OF - continue Flomax    Chronic renal insufficiency, stage IV (severe) -  BUN/Cr 29/1.94 on admit, up to 30/2.25 after cath, now beginning to trend down. ARB d/c'd     Renal cysts, acquired, bilateral - f/u with IM    Hyperkalemia - K+ 5.8 on admission, ARB d/c'd, Kayexalate 30 gm x 1, now normalized.  Plan - d/c today.  SignedRosaria Griffin , PA-C 7:17 AM 08/10/2014

## 2014-08-10 NOTE — Discharge Instructions (Signed)
Get lab work at your next office visit, you are mildly anemic and need to follow your kidney function also.  PLEASE REMEMBER TO BRING ALL OF YOUR MEDICATIONS TO EACH OF YOUR FOLLOW-UP OFFICE VISITS.  PLEASE ATTEND ALL SCHEDULED FOLLOW-UP APPOINTMENTS.   Activity: Increase activity slowly as tolerated. You may shower, but no soaking baths (or swimming) for 1 week. No driving for 1 week. No lifting over 5 lbs for 2 weeks. No sexual activity for 1 week.   You May Return to Work: [place] 3 weeks (if applicable)  Wound Care: You may wash cath site gently with soap and water. Keep cath site clean and dry. If you notice pain, swelling, bleeding or pus at your cath site, please call [phone].    Cardiac Cath Site Care Refer to this sheet Macyn the next few weeks. These instructions provide you with information on caring for yourself after your procedure. Your caregiver may also give you more specific instructions. Your treatment has been planned according to current medical practices, but problems sometimes occur. Call your caregiver if you have any problems or questions after your procedure. HOME CARE INSTRUCTIONS  You may shower 24 hours after the procedure. Remove the bandage (dressing) and gently wash the site with plain soap and water. Gently pat the site dry.   Do not apply powder or lotion to the site.   Do not sit Ova a bathtub, swimming pool, or whirlpool for 5 to 7 days.   No bending, squatting, or lifting anything over 10 pounds (4.5 kg) as directed by your caregiver.   Inspect the site at least twice daily.   Do not drive home if you are discharged the same day of the procedure. Have someone else drive you.   You may drive 24 hours after the procedure unless otherwise instructed by your caregiver.  What to expect:  Any bruising will usually fade within 1 to 2 weeks.   Blood that collects Trigo the tissue (hematoma) may be painful to the touch. It should usually decrease Shriyans size and  tenderness within 1 to 2 weeks.  SEEK IMMEDIATE MEDICAL CARE IF:  You have unusual pain at the site or down the affected limb.   You have redness, warmth, swelling, or pain at the site.   You have drainage (other than a small amount of blood on the dressing).   You have chills.   You have a fever or persistent symptoms for more than 72 hours.   You have a fever and your symptoms suddenly get worse.   Your leg becomes pale, cool, tingly, or numb.   You have heavy bleeding from the site. Hold pressure on the site.  Document Released: 12/02/2010 Document Revised: 10/19/2011 Document Reviewed:

## 2014-08-10 NOTE — Telephone Encounter (Signed)
Suanne Marker called Kya[place] stating that Mr. Largo is a TCM pt and would need to be seen [place] the next 7-10 days. There is nothing available with Dr. [place] and the next APP appoinment is going to be with Brittainy on 10/13. She did not want to take that appointment. Please call  Thanks

## 2014-08-10 NOTE — Discharge Summary (Signed)
Personally seen and examined. Agree with above. Potassium stable, chest pain-free, cardiac rehabilitation. Candee Furbish, MD

## 2014-08-10 NOTE — Discharge Summary (Signed)
CARDIOLOGY DISCHARGE SUMMARY   Patient ID: Fred Griffin MRN: [id number] DOB/AGE: 01/13/1927 80 y.o.  Admit date: 08/06/2014 Discharge date: 08/10/2014  PCP: Drema Pry, DO Primary Cardiologist: Dr. Martinique  Primary Discharge Diagnosis:  STEMI involving the right coronary artery Secondary Discharge Diagnosis:  Active Problems:   ST elevation myocardial infarction (STEMI) involving right coronary artery Norris recovery phase   HYPERTENSION   PROSTATE CANCER, HX OF   Chronic renal insufficiency, stage IV (severe)   Renal cysts, acquired, bilateral   Hyperkalemia   Anemia  Procedures: Left Heart Cath, Selective Coronary Angiography, LV angiography, PTCA/Stent of the distal RCA  Hospital Course: Fred Griffin is a 80 y.o. male with no previous history of CAD. He developed chest pain and shortness of breath. He does not speak [place] but his son called EMS. He had inferior ST elevation and bradycardia. He received baby aspirin but no nitroglycerin because he was also hypotensive. He had inferior ST elevation on his ECG. He was transported emergently to the hospital and taken directly to the Cath Lab.  Cardiac catheterization results below. He had a drug-eluting stent to the RCA, an OM 1 with 90% stenosis is for medical therapy. His EF was 50-55% by echocardiogram, results below. He tolerated the procedure well. He was started on a statin, a beta blocker, and dual antiplatelet therapy.  He was noted to be hyperkalemic on admission, with a potassium level of 5.8. He had been on Cozaar prior to admission and this was discontinued. He was also noted to have renal insufficiency with a BUN/creatinine of 29/1.94 on admission. He was noted to be anemic on admission with a hemoglobin of 9.7 and hematocrit of 20.9. His MCV is within normal limits had no obvious bleeding issues. No other recent labs are available for review. His hemoglobin and hematocrit remained stable during his stay. He is to follow up  with primary care for this.  His potassium was followed and improved slightly with hydration and discontinuation of the ARB, but was still abnormal so he was given one dose of Kayexalate 30 g. His potassium has normalized and he is to remain off ACE inhibitors/ARB's. He was hydrated after the cath and his BUN/creatinine did increase slightly to 30/2.25, but by discharge were beginning to trend down. This can be followed as an outpatient.  He ambulated with cardiac rehabilitation and did well with this, no chest pain. He initially had some shortness of breath, but this improved. Through an interpreter, he was educated by cardiac rehabilitation. He was educated on MI restrictions, heart-healthy lifestyle modifications and exercise guidelines.  On 09/28, he was seen by Dr. Marlou Porch and all data were reviewed. He was doing well and no further inpatient workup was indicated. He is considered stable for discharge, to follow up as an outpatient.  Labs:   Lab Results  Component Value Date   WBC 7.0 08/07/2014   HGB 9.3* 08/07/2014   HCT 27.8* 08/07/2014   MCV 88.5 08/07/2014   PLT 239 08/07/2014    Recent Labs Lab 08/06/14 1402  08/10/14 0445  NA 141  < > 140  K 5.5*  < > 4.4  CL 107  < > 106  CO2 18*  < > 20  BUN 29*  < > 31*  CREATININE 1.94*  < > 2.11*  CALCIUM 8.1*  < > 8.7  PROT 6.3  --   --   BILITOT <0.2*  --   --   [facility]  78  --   --   ALT 10  --   --   AST 20  --   --   GLUCOSE 154*  < > 104*  < > = values Tyller this interval not displayed.  Lab Results  Component Value Date   CKTOTAL 85 08/06/2014   CKMB 1.7 08/06/2014   TROPONINI 6.60* 08/07/2014    Lipid Panel     Component Value Date/Time   CHOL 179 08/07/2014 0333   TRIG 144 08/07/2014 0333   HDL 38* 08/07/2014 0333   CHOLHDL 4.7 08/07/2014 0333   VLDL 29 08/07/2014 0333   LDLCALC 112* 08/07/2014 0333    Recent Labs  08/08/14 1025  INR 1.05      Radiology: Portable Chest X-ray 1 View 08/07/2014   CLINICAL DATA:  Chest  pain  EXAM: PORTABLE CHEST - 1 VIEW  COMPARISON:  02/07/2009  FINDINGS: Cardiac silhouette is normal Karthik size. Aorta is mildly uncoiled. No mediastinal or hilar masses.  Pleural parenchymal scarring is noted at the lung apices. Milder scarring is noted at the lung bases. Small granuloma at the right lung base. No lung consolidation or edema. No pleural effusion or pneumothorax.  Bony thorax is demineralized but intact.  No change from prior study.  IMPRESSION: No acute cardiopulmonary disease.   Electronically Signed   By: Lajean Manes M.D.   On: 08/07/2014 07:25    Cardiac Cath: 08/07/1999 Coronary angiography:  Coronary dominance: right  Left mainstem: Normal  Left anterior descending (LAD): The LAD is tortuous and moderately calcified. There is 30% proximal LAD disease. The mid LAD is diffusely diseased up to 50%.  Left circumflex (LCx): The LCx gives off a single OM branch then terminates Damien the AV groove. The first OM has a 90% proximal stenosis followed by diffuse 70-80% disease Sylvio the mid vessel.  Right coronary artery (RCA): The RCA is a dominant vessel. There is heavy calcification throughout. The proximal vessel has 30% stenosis. The distal vessel after the crux has a 99% stenosis. There is a long 50-60% stenosis Ronak the RCA farther distal. The PDA and PLOM are without significant disease.  Left ventriculography: not done  PCI Procedure Note: Following the diagnostic procedure, the decision was made to proceed with PCI of the RCA. Weight-based bivalirudin was given for anticoagulation. Brilinta 180 mg was given orally. Once a therapeutic ACT was achieved, a 6 [place] FR4 guide catheter was inserted. A prowater coronary guidewire was used to cross the lesion. The lesion was predilated with a 2.5 mm balloon. At this point I was unable to cross the lesion with a stent. A PT moderate support wire was place across the lesion and this was used with the prowater as a buddy wire but I was still unable to  cross the lesion with a stent. The lesion was then predilated with a 3.0 mm balloon. I was then able to barely cover the lesion with a stent. The lesion was then stented with a 2.5 x 20 mm Promus stent. The stent was postdilated with a 3.0 mm noncompliant balloon. Following PCI, there was 0% residual stenosis and TIMI-3 flow. The disease Wadie the distal RCA was unchanged. Since this disease was not severe I would recommend treating it medically. Final angiography confirmed an excellent result. Femoral hemostasis was achieved with Manual compression. The right radial sheath was removed and a radial band was placed. The patient tolerated the PCI procedure well. There were no immediate procedural complications. The  patient was transferred to the post catheterization recovery area for further monitoring. He was pain free at the end of procedure but did have continued ST elevation Gianni the inferior leads on Ecg.  PCI Data:  Vessel - RCA/Segment - distal  Percent Stenosis (pre) 99%  TIMI-flow 2  Stent 2.5 x 20 mm Promus  Percent Stenosis (post) 0%  TIMI-flow (post) 3  Final Conclusions:  1. Severe 2 vessel obstructive CAD with culprit lesion Bronco the distal RCA.  2. Successful stenting of the distal RCA with a DES.  Recommendations: I would continue DAPT for one year. I would treat his residual disease medically. If he has refractory angina despite optimal medical therapy PCI of the OM could be considered but would be complex. At his age hopefully medical therapy will be enough. If Vere the future he needs cardiac cath the femoral approach should be used. If PCI of the RCA is needed Lason the future I would use a more supportive guide +/- Guideliner.   EKG: 08/08/2014 Sinus rhythm, inferior T wave changes consistent with evolving MI Vent. rate 65 BPM PR interval 158 ms QRS duration 72 ms QT/QTc 414/430 ms P-R-T axes 47 -62 41  Echo: 08/07/2014 Conclusions - Left ventricle: The cavity size was normal. Wall  thickness was normal. Systolic function was normal. The estimated ejection fraction was Zebadiah the range of 50% to 55%. There is akinesis of the inferolateral myocardium. Doppler parameters are consistent with abnormal left ventricular relaxation (grade 1 diastolic dysfunction). Doppler parameters are consistent with high ventricular filling pressure. - Aortic valve: There was mild regurgitation. - Pericardium, extracardiac: A trivial pericardial effusion was identified. Impressions: - Inferolateral akinesis with overall low normal LV function; grade 1 diastolic dysfunction; mild AI, trace MR and TR.   FOLLOW UP PLANS AND APPOINTMENTS No Known Allergies   Medication List    STOP taking these medications       losartan 50 MG tablet  Commonly known as:  COZAAR      TAKE these medications       aspirin 81 MG EC tablet  Take 1 tablet (81 mg total) by mouth daily.     atorvastatin 80 MG tablet  Commonly known as:  LIPITOR  Take 1 tablet (80 mg total) by mouth daily at 6 PM.     carvedilol 6.25 MG tablet  Commonly known as:  COREG  Take 1 tablet (6.25 mg total) by mouth 2 (two) times daily with a meal.     nitroGLYCERIN 0.4 MG SL tablet  Commonly known as:  NITROSTAT  Place 1 tablet (0.4 mg total) under the tongue every 5 (five) minutes x 3 doses as needed for chest pain.     pantoprazole 40 MG tablet  Commonly known as:  PROTONIX  Take 1 tablet (40 mg total) by mouth daily at 6 PM.     tamsulosin 0.4 MG Caps capsule  Commonly known as:  FLOMAX  Take 0.4 mg by mouth every evening.     ticagrelor 90 MG Tabs tablet  Commonly known as:  BRILINTA  Take 1 tablet (90 mg total) by mouth 2 (two) times daily.        Discharge Instructions   Diet - low sodium heart healthy    Complete by:  As directed      Increase activity slowly    Complete by:  As directed           Follow-up Information   Follow up with  Peter Martinique, MD. (The office will call)    Specialty:   Cardiology   Contact information:   [address] [place] [address] [phone]       Schedule an appointment as soon as possible for a visit with Drema Pry, DO. (To followup renal function and anemia)    Specialty:  Internal Medicine   Contact information:   [facility] [address] [place] [address] [phone]       BRING ALL MEDICATIONS WITH YOU TO FOLLOW UP APPOINTMENTS  Time spent with patient to include physician time: 45 min Signed: Rosaria Ferries, PA-C 08/10/2014, 10:48 AM Co-Sign MD

## 2014-08-19 ENCOUNTER — Telehealth: Payer: Self-pay | Admitting: Cardiology

## 2014-08-19 NOTE — Telephone Encounter (Signed)
Need prior authorization for Pantoprazole.

## 2014-08-21 NOTE — Telephone Encounter (Signed)
Fred Griffin has this been done

## 2014-08-21 NOTE — Telephone Encounter (Signed)
Needs Prior Auth

## 2014-08-26 ENCOUNTER — Encounter: Payer: Self-pay | Admitting: Cardiology

## 2014-08-26 ENCOUNTER — Ambulatory Visit (INDEPENDENT_AMBULATORY_CARE_PROVIDER_SITE_OTHER): Payer: Medicare Other | Admitting: Cardiology

## 2014-08-26 VITALS — BP 112/60 | HR 68 | Ht 69.5 in | Wt 146.4 lb

## 2014-08-26 DIAGNOSIS — I10 Essential (primary) hypertension: Secondary | ICD-10-CM

## 2014-08-26 DIAGNOSIS — N184 Chronic kidney disease, stage 4 (severe): Secondary | ICD-10-CM

## 2014-08-26 DIAGNOSIS — I2111 ST elevation (STEMI) myocardial infarction involving right coronary artery: Secondary | ICD-10-CM

## 2014-08-26 DIAGNOSIS — I2510 Atherosclerotic heart disease of native coronary artery without angina pectoris: Secondary | ICD-10-CM

## 2014-08-26 DIAGNOSIS — N189 Chronic kidney disease, unspecified: Secondary | ICD-10-CM

## 2014-08-26 DIAGNOSIS — I2109 ST elevation (STEMI) myocardial infarction involving other coronary artery of anterior wall: Secondary | ICD-10-CM

## 2014-08-26 MED ORDER — OMEPRAZOLE 20 MG PO CPDR: 20.0000 mg | DELAYED_RELEASE_CAPSULE | Freq: Every day | ORAL | Status: DC

## 2014-08-26 NOTE — Progress Notes (Signed)
Fred Griffin Spanish Date of Birth: 07/01/1927 Medical Record #[id number]  History of Present Illness: Fred Griffin is an 80 yo [place] male seen for follow up after recent hospitalization for inferior STEMI. He is seen with an interpreter. He was admitted 9/24-9/28 with an inferior STEMI. He had Vfib and was defibrillated x 1. There was subtotal stenosis Dasan the RCA at the crux this was stented with a 2.5 x 20 mm Promus stent. He also had diffuse 50% distal LAD disease and 90% OM1 disease that will be treated medically. EF was 50-55% by Echo. He had increase Bode creatinine from 2.0 to 2.25. He also had hyperkalemia. ARB was discontinued.  On follow up today he is doing well. He doesn't understand why he was [place] the hospital. He lives alone. No chest pain, SOB, palpitations, or dizziness.     Medication List       This list is accurate as of: 08/26/14  1:13 PM.  Always use your most recent med list.               aspirin 81 MG EC tablet  Take 1 tablet (81 mg total) by mouth daily.     atorvastatin 80 MG tablet  Commonly known as:  LIPITOR  Take 1 tablet (80 mg total) by mouth daily at 6 PM.     carvedilol 6.25 MG tablet  Commonly known as:  COREG  Take 1 tablet (6.25 mg total) by mouth 2 (two) times daily with a meal.     nitroGLYCERIN 0.4 MG SL tablet  Commonly known as:  NITROSTAT  Place 1 tablet (0.4 mg total) under the tongue every 5 (five) minutes x 3 doses as needed for chest pain.     omeprazole 20 MG capsule  Commonly known as:  PRILOSEC  Take 1 capsule (20 mg total) by mouth daily.     tamsulosin 0.4 MG Caps capsule  Commonly known as:  FLOMAX  Take 0.4 mg by mouth every evening.     ticagrelor 90 MG Tabs tablet  Commonly known as:  BRILINTA  Take 1 tablet (90 mg total) by mouth 2 (two) times daily.        No Known Allergies  Past Medical History  Diagnosis Date  . Hypertension   . Syncope 01/2010  . Prostate cancer     Followed by Dr [name] -  Urologist [facility]    . History of colonoscopy   . ST elevation myocardial infarction (STEMI) involving right coronary artery Fred Griffin recovery phase 07/2014    DES to the RCA, 90% OM1 treated medically, EF 50% by echo  . CAD Fred Griffin native artery   . CKD (chronic kidney disease), stage III     Past Surgical History  Procedure Laterality Date  . Cataract extraction      x 2   . Coronary angioplasty with stent placement  08/06/2014    LAD 50%, OM1  90/80%, RCA 99%, then 60%, treated with a 2.5 x 20 mm Promus stent to the RCA    History   Social History  . Marital Status: Married    Spouse Name: N/A    Number of Children: N/A  . Years of Education: N/A   Occupational History  . Retired    Social History Main Topics  . Smoking status: Current Every Day Smoker -- 0.25 packs/day    Types: Cigarettes  . Smokeless tobacco: None  . Alcohol Use:   . Drug Use:   .  Sexual Activity:    Other Topics Concern  . None   Social History Narrative   [place]    W[place], [place] with daughter)   2 sons ( 1 son [place], 1 son lives nearby)  2 daughters    Family History  Problem Relation Age of Onset  . Cancer Brother     Intestinal Cancer    Review of Systems: As noted Fred Griffin HPI.  All other systems were reviewed and are negative.  Physical Exam: BP 112/60  Pulse 68  Ht 5' 9.5" (1.765 m)  Wt 146 lb 6.4 oz (66.407 kg)  BMI 21.32 kg/m2 Filed Weights   08/26/14 1157  Weight: 146 lb 6.4 oz (66.407 kg)  GENERAL:  Well appearing, elderly [place] male Fred Griffin NAD HEENT:  PERRL, EOMI, sclera are clear. Oropharynx is clear. NECK:  No jugular venous distention, carotid upstroke brisk and symmetric, no bruits, no thyromegaly or adenopathy LUNGS:  Clear to auscultation bilaterally CHEST:  Unremarkable HEART:  RRR,  PMI not displaced or sustained,S1 and S2 within normal limits, no S3, no S4: no clicks, no rubs, no murmurs ABD:  Soft, nontender. BS +, no masses or bruits. No hepatomegaly, no  splenomegaly EXT:  2 + pulses throughout, no edema, no cyanosis no clubbing. Radial cath site has healed well.  SKIN:  Warm and dry.  No rashes NEURO:  Alert and oriented x 3. Cranial nerves II through XII intact. PSYCH:  Cognitively intact    LABORATORY DATA:   Assessment / Plan: 1. CAD with recent STEMI. S/p DES of RCA. Asymptomatic. Continue DAPT for one year. On beta blocker and statin. 2. CKD stage 3-4. Will repeat BMET today. Avoid ACEi or ARB. 3. Hyperkalemia. Repeat potassium today. 4. HTN-controlled. 5. Hypercholesterolemia. Now on lipitor. Will check fasting lab work on follow up Ronak 3 months.

## 2014-08-26 NOTE — Addendum Note (Signed)
Addended by: Golden Hurter D on: 08/26/2014 12:39 PM   Modules accepted: Orders, Medications

## 2014-08-26 NOTE — Patient Instructions (Signed)
Continue your current therapy  We will check your kidney function today  I will see you Fred Griffin 3 months with fasting lab work

## 2014-08-26 NOTE — Telephone Encounter (Signed)
Insurance will not cover protonix.Spoke to [place] he prescribed omeprazole 20 mg daily.Prescription sent to pharmacy.

## 2014-08-27 LAB — BASIC METABOLIC PANEL
BUN: 40 mg/dL — ABNORMAL HIGH (ref 6–23)
CO2: 20 mEq/L (ref 19–32)
CREATININE: 2.14 mg/dL — AB (ref 0.50–1.35)
Calcium: 9.1 mg/dL (ref 8.4–10.5)
Chloride: 111 mEq/L (ref 96–112)
Glucose, Bld: 120 mg/dL — ABNORMAL HIGH (ref 70–99)
POTASSIUM: 4.5 meq/L (ref 3.5–5.3)
Sodium: 140 mEq/L (ref 135–145)

## 2014-10-22 ENCOUNTER — Encounter (HOSPITAL_COMMUNITY): Payer: Self-pay | Admitting: Cardiology

## 2014-12-14 ENCOUNTER — Emergency Department (HOSPITAL_BASED_OUTPATIENT_CLINIC_OR_DEPARTMENT_OTHER): Payer: Medicare Other

## 2014-12-14 ENCOUNTER — Emergency Department (HOSPITAL_BASED_OUTPATIENT_CLINIC_OR_DEPARTMENT_OTHER)
Admission: EM | Admit: 2014-12-14 | Discharge: 2014-12-14 | Disposition: A | Discharge status: Home / Self Care | Payer: Medicare Other | Attending: Emergency Medicine | Admitting: Emergency Medicine

## 2014-12-14 ENCOUNTER — Encounter (HOSPITAL_BASED_OUTPATIENT_CLINIC_OR_DEPARTMENT_OTHER): Payer: Self-pay | Admitting: Emergency Medicine

## 2014-12-14 DIAGNOSIS — I2510 Atherosclerotic heart disease of native coronary artery without angina pectoris: Secondary | ICD-10-CM | POA: Diagnosis not present

## 2014-12-14 DIAGNOSIS — I252 Old myocardial infarction: Secondary | ICD-10-CM | POA: Diagnosis not present

## 2014-12-14 DIAGNOSIS — I129 Hypertensive chronic kidney disease with stage 1 through stage 4 chronic kidney disease, or unspecified chronic kidney disease: Secondary | ICD-10-CM | POA: Diagnosis not present

## 2014-12-14 DIAGNOSIS — R05 Cough: Secondary | ICD-10-CM | POA: Diagnosis not present

## 2014-12-14 DIAGNOSIS — Z7982 Long term (current) use of aspirin: Secondary | ICD-10-CM | POA: Diagnosis not present

## 2014-12-14 DIAGNOSIS — Z79899 Other long term (current) drug therapy: Secondary | ICD-10-CM | POA: Diagnosis not present

## 2014-12-14 DIAGNOSIS — M6281 Muscle weakness (generalized): Secondary | ICD-10-CM | POA: Insufficient documentation

## 2014-12-14 DIAGNOSIS — R51 Headache: Secondary | ICD-10-CM | POA: Insufficient documentation

## 2014-12-14 DIAGNOSIS — N183 Chronic kidney disease, stage 3 (moderate): Secondary | ICD-10-CM | POA: Diagnosis not present

## 2014-12-14 DIAGNOSIS — R0602 Shortness of breath: Secondary | ICD-10-CM | POA: Diagnosis not present

## 2014-12-14 DIAGNOSIS — Z9861 Coronary angioplasty status: Secondary | ICD-10-CM | POA: Insufficient documentation

## 2014-12-14 DIAGNOSIS — Z8546 Personal history of malignant neoplasm of prostate: Secondary | ICD-10-CM | POA: Insufficient documentation

## 2014-12-14 DIAGNOSIS — R531 Weakness: Secondary | ICD-10-CM | POA: Diagnosis present

## 2014-12-14 DIAGNOSIS — Z720 Tobacco use: Secondary | ICD-10-CM | POA: Diagnosis not present

## 2014-12-14 DIAGNOSIS — Z9889 Other specified postprocedural states: Secondary | ICD-10-CM | POA: Diagnosis not present

## 2014-12-14 DIAGNOSIS — R42 Dizziness and giddiness: Secondary | ICD-10-CM | POA: Insufficient documentation

## 2014-12-14 LAB — CBC WITH DIFFERENTIAL/PLATELET
Basophils Absolute: 0 10*3/uL (ref 0.0–0.1)
Basophils Relative: 0 % (ref 0–1)
Eosinophils Absolute: 0.1 10*3/uL (ref 0.0–0.7)
Eosinophils Relative: 1 % (ref 0–5)
HEMATOCRIT: 34.2 % — AB (ref 39.0–52.0)
Hemoglobin: 11.1 g/dL — ABNORMAL LOW (ref 13.0–17.0)
LYMPHS ABS: 0.9 10*3/uL (ref 0.7–4.0)
Lymphocytes Relative: 10 % — ABNORMAL LOW (ref 12–46)
MCH: 30 pg (ref 26.0–34.0)
MCHC: 32.5 g/dL (ref 30.0–36.0)
MCV: 92.4 fL (ref 78.0–100.0)
Monocytes Absolute: 0.7 10*3/uL (ref 0.1–1.0)
Monocytes Relative: 8 % (ref 3–12)
Neutro Abs: 6.9 10*3/uL (ref 1.7–7.7)
Neutrophils Relative %: 81 % — ABNORMAL HIGH (ref 43–77)
PLATELETS: 274 10*3/uL (ref 150–400)
RBC: 3.7 MIL/uL — ABNORMAL LOW (ref 4.22–5.81)
RDW: 13.4 % (ref 11.5–15.5)
WBC: 8.7 10*3/uL (ref 4.0–10.5)

## 2014-12-14 LAB — TROPONIN I: Troponin I: <0.03 ng/mL (ref <0.031)

## 2014-12-14 LAB — URINE MICROSCOPIC-ADD ON

## 2014-12-14 LAB — URINALYSIS, ROUTINE W REFLEX MICROSCOPIC
Bilirubin Urine: NEGATIVE
Glucose, UA: NEGATIVE mg/dL
KETONES UR: NEGATIVE mg/dL
LEUKOCYTES UA: NEGATIVE
Nitrite: NEGATIVE
PH: 5 (ref 5.0–8.0)
Protein, ur: NEGATIVE mg/dL
Specific Gravity, Urine: 1.015 (ref 1.005–1.030)
Urobilinogen, UA: 0.2 mg/dL (ref 0.0–1.0)

## 2014-12-14 LAB — COMPREHENSIVE METABOLIC PANEL
ALT: 13 U/L (ref 0–53)
ANION GAP: 5 (ref 5–15)
AST: 25 U/L (ref 0–37)
Albumin: 3.6 g/dL (ref 3.5–5.2)
Alkaline Phosphatase: 126 U/L — ABNORMAL HIGH (ref 39–117)
BILIRUBIN TOTAL: 0.6 mg/dL (ref 0.3–1.2)
BUN: 37 mg/dL — AB (ref 6–23)
CHLORIDE: 109 mmol/L (ref 96–112)
CO2: 23 mmol/L (ref 19–32)
Calcium: 8.9 mg/dL (ref 8.4–10.5)
Creatinine, Ser: 2.05 mg/dL — ABNORMAL HIGH (ref 0.50–1.35)
GFR calc non Af Amer: 28 mL/min — ABNORMAL LOW (ref >90)
GFR, EST AFRICAN AMERICAN: 32 mL/min — AB (ref >90)
GLUCOSE: 149 mg/dL — AB (ref 70–99)
POTASSIUM: 4.6 mmol/L (ref 3.5–5.1)
Sodium: 137 mmol/L (ref 135–145)
Total Protein: 7.7 g/dL (ref 6.0–8.3)

## 2014-12-14 LAB — BRAIN NATRIURETIC PEPTIDE: B Natriuretic Peptide: 127.9 pg/mL — ABNORMAL HIGH (ref 0.0–100.0)

## 2014-12-14 MED ORDER — ACETAMINOPHEN 325 MG PO TABS
650.0000 mg | ORAL_TABLET | Freq: Once | ORAL | Status: AC
  Administered 2014-12-14: 650 mg via ORAL
  Filled 2014-12-14: qty 2

## 2014-12-14 MED ORDER — METOCLOPRAMIDE HCL 5 MG/ML IJ SOLN
5.0000 mg | Freq: Once | INTRAMUSCULAR | Status: AC
  Administered 2014-12-14: 5 mg via INTRAVENOUS
  Filled 2014-12-14: qty 2

## 2014-12-14 MED ORDER — SODIUM CHLORIDE 0.9 % IV BOLUS (SEPSIS)
500.0000 mL | Freq: Once | INTRAVENOUS | Status: AC
  Administered 2014-12-14: 500 mL via INTRAVENOUS

## 2014-12-14 NOTE — ED Provider Notes (Addendum)
CSN: [id number]     Arrival date & time 12/14/14  1044 History   First MD Initiated Contact with Patient 12/14/14 1045     Chief Complaint  Patient presents with  . Weakness     (Consider location/radiation/quality/duration/timing/severity/associated sxs/prior Treatment) Patient is a 81 y.o. male presenting with weakness. The history is provided by the patient.  Weakness This is a new problem. Episode onset: 2-3 days ago. The problem occurs constantly. The problem has not changed since onset.Associated symptoms include headaches and shortness of breath (chronic w/ acute worsening over the last week). Pertinent negatives include no chest pain and no abdominal pain. Nothing aggravates the symptoms. Nothing relieves the symptoms. He has tried nothing for the symptoms. The treatment provided no relief.    Past Medical History  Diagnosis Date  . Hypertension   . Syncope 01/2010  . Prostate cancer     Followed by Dr [name] -  Urologist [facility]  . History of colonoscopy   . ST elevation myocardial infarction (STEMI) involving right coronary artery [place] recovery phase 07/2014    DES to the RCA, 90% OM1 treated medically, EF 50% by echo  . CAD J[place]my native artery   . CKD (chronic kidney disease), stage III    Past Surgical History  Procedure Laterality Date  . Cataract extraction      x 2   . Coronary angioplasty with stent placement  08/06/2014    LAD 50%, OM1  90/80%, RCA 99%, then 60%, treated with a 2.5 x 20 mm Promus stent to the RCA  . Left heart catheterization with coronary angiogram N/A 08/06/2014    Procedure: LEFT HEART CATHETERIZATION WITH CORONARY ANGIOGRAM;  Surgeon: [name], MD;  Location: [facility] CATH LAB;  Service: Cardiovascular;  Laterality: N/A;   Family History  Problem Relation Age of Onset  . Cancer Brother     Intestinal Cancer   History  Substance Use Topics  . Smoking status: Current Every Day Smoker -- 0.25 packs/day    Types: Cigarettes  . Smokeless  tobacco: Not on file  . Alcohol Use: Not on file    Review of Systems  Constitutional: Negative for fever.  HENT: Negative for drooling and rhinorrhea.   Eyes: Negative for pain.  Respiratory: Positive for cough (non-productive) and shortness of breath (chronic w/ acute worsening over the last week).   Cardiovascular: Negative for chest pain and leg swelling.  Gastrointestinal: Negative for nausea, vomiting, abdominal pain and diarrhea.  Genitourinary: Negative for dysuria and hematuria.  Musculoskeletal: Negative for gait problem and neck pain.  Skin: Negative for color change.  Neurological: Positive for dizziness, weakness (generalized) and headaches. Negative for numbness.  Hematological: Negative for adenopathy.  Psychiatric/Behavioral: Negative for behavioral problems.  All other systems reviewed and are negative.     Allergies  Review of patient's allergies indicates no known allergies.  Home Medications   Prior to Admission medications   Medication Sig Start Date End Date Taking? Authorizing Provider  amLODipine (NORVASC) 10 MG tablet Take 10 mg by mouth daily.   Yes Historical Provider, MD  atorvastatin (LIPITOR) 80 MG tablet Take 1 tablet (80 mg total) by mouth daily at 6 PM. 08/10/14  Yes Rhonda G Barrett, PA-C  carvedilol (COREG) 6.25 MG tablet Take 1 tablet (6.25 mg total) by mouth 2 (two) times daily with a meal. 08/10/14  Yes Rhonda G Barrett, PA-C  nitroGLYCERIN (NITROSTAT) 0.4 MG SL tablet Place 1 tablet (0.4 mg total) under the  tongue every 5 (five) minutes x 3 doses as needed for chest pain. 08/10/14  Yes Rhonda G Barrett, PA-C  sodium bicarbonate 650 MG tablet Take 650 mg by mouth 4 (four) times daily.   Yes Historical Provider, MD  aspirin EC 81 MG EC tablet Take 1 tablet (81 mg total) by mouth daily. 08/10/14   Rhonda G Barrett, PA-C  omeprazole (PRILOSEC) 20 MG capsule Take 1 capsule (20 mg total) by mouth daily. 08/26/14   [name], MD  tamsulosin  (FLOMAX) 0.4 MG CAPS capsule Take 0.4 mg by mouth every evening.    Historical Provider, MD  ticagrelor (BRILINTA) 90 MG TABS tablet Take 1 tablet (90 mg total) by mouth 2 (two) times daily. 08/10/14   Rhonda G Barrett, PA-C   BP 135/80 mmHg  Pulse 62  Temp(Src) 97.9 F (36.6 C)  Resp 16  SpO2 99% Physical Exam  Constitutional: He is oriented to person, place, and time. He appears well-developed and well-nourished.  HENT:  Head: Normocephalic and atraumatic.  Right Ear: External ear normal.  Left Ear: External ear normal.  Nose: Nose normal.  Mouth/Throat: Oropharynx is clear and moist. No oropharyngeal exudate.  Eyes: Conjunctivae and EOM are normal. Pupils are equal, round, and reactive to light.  Neck: Normal range of motion. Neck supple.  Cardiovascular: Normal rate, regular rhythm, normal heart sounds and intact distal pulses.  Exam reveals no gallop and no friction rub.   No murmur heard. Pulmonary/Chest: Effort normal and breath sounds normal. No respiratory distress. He has no wheezes.  Abdominal: Soft. Bowel sounds are normal. He exhibits no distension. There is no tenderness. There is no rebound and no guarding.  Musculoskeletal: Normal range of motion. He exhibits no edema or tenderness.  Neurological: He is alert and oriented to person, place, and time.  alert, oriented x3 speech: normal [place] context and clarity memory: intact grossly cranial nerves II-XII: intact motor strength: full proximally and distally sensation: intact to light touch diffusely  cerebellar: finger-to-nose intact bilaterally gait: normal gait  Skin: Skin is warm and dry.  Psychiatric: He has a normal mood and affect. His behavior is normal.  Nursing note and vitals reviewed.   ED Course  Procedures (including critical care time) Labs Review Labs Reviewed  CBC WITH DIFFERENTIAL/PLATELET - Abnormal; Notable for the following:    RBC 3.70 (*)    Hemoglobin 11.1 (*)    HCT 34.2 (*)     Neutrophils Relative % 81 (*)    Lymphocytes Relative 10 (*)    All other components within normal limits  COMPREHENSIVE METABOLIC PANEL - Abnormal; Notable for the following:    Glucose, Bld 149 (*)    BUN 37 (*)    Creatinine, Ser 2.05 (*)    Alkaline Phosphatase 126 (*)    GFR calc non Af Amer 28 (*)    GFR calc Af Amer 32 (*)    All other components within normal limits  URINALYSIS, ROUTINE W REFLEX MICROSCOPIC - Abnormal; Notable for the following:    Hgb urine dipstick SMALL (*)    All other components within normal limits  BRAIN NATRIURETIC PEPTIDE - Abnormal; Notable for the following:    B Natriuretic Peptide 127.9 (*)    All other components within normal limits  TROPONIN I  URINE MICROSCOPIC-ADD ON  TROPONIN I    Imaging Review Dg Chest 2 View  12/14/2014   CLINICAL DATA:  Weakness for a month, recently worsening.  EXAM: CHEST  2 VIEW  COMPARISON:  08/07/2014  FINDINGS: Chronic pulmonary hyperinflation and biapical lung scarring. Linear scar Masayoshi the right upper lung stable compared to recent studies. There is an indistinct nodular density between the anterior left fifth and sixth ribs measuring 11 mm. No contralateral nipple shadow noted Maxi the lateral projection. Calcified granuloma Kailer the right lower lobe. There is no edema, consolidation, effusion (blunting of the posterior costophrenic sulci is likely from hyperinflation), or pneumothorax. Normal heart size and stable aortic tortuosity.  IMPRESSION: 1. No acute finding. 2. Questionable 11 mm nodule Huntington the left lower lobe. Recommend nonemergent chest CT given extensive emphysema.   Electronically Signed   By: Jorje Guild M.D.   On: 12/14/2014 12:13   Ct Head Wo Contrast  12/14/2014   CLINICAL DATA:  Weakness for 1 month.  EXAM: CT HEAD WITHOUT CONTRAST  TECHNIQUE: Contiguous axial images were obtained from the base of the skull through the vertex without intravenous contrast.  COMPARISON:  None.  FINDINGS: The brain is  atrophic with extensive hypoattenuation [place] the subcortical and periventricular deep white matter consistent chronic microvascular ischemic change. Remote lacunar infarction right basal ganglia and left thalamus is identified. No evidence of acute abnormality including infarct, hemorrhage, mass lesion, mass effect, midline shift or abnormal extra-axial fluid collection. No hydrocephalus or pneumocephalus. The calvarium is intact.  IMPRESSION: No acute abnormality.  Atrophy and extensive chronic microvascular ischemic change.   Electronically Signed   By: Inge Rise M.D.   On: 12/14/2014 12:01     EKG Interpretation   Date/Time:  Monday December 14 2014 11:57:31 EST Ventricular Rate:  58 PR Interval:  154 QRS Duration: 80 QT Interval:  432 QTC Calculation: 424 R Axis:   -63 Text Interpretation:  Sinus bradycardia Left axis deviation T wave  abnormality, consider inferior ischemia T wave inversion more prominent [place]  V5, otherwise no significant change Confirmed by Audree Schrecengost  MD, Rahaf Carbonell  ([id number]) on 12/14/2014 11:59:08 AM      MDM   Final diagnoses:  SOB (shortness of breath)  Generalized weakness    11:10 AM 81 y.o. male w hx of stemi on 08/06/14 on brilinta who presents with gradual onset worsening shortness of breath over the last week. He also notes a gradual onset headache during the same amount of time. He has had subjective fevers. He denies any vomiting or diarrhea. He denies any chest pain. He is afebrile and vital signs are unremarkable here. We'll get screening labs and imaging.  Cath on 08/06/14 1. Severe 2 vessel obstructive CAD with culprit lesion [place] the distal RCA. 2. Successful stenting of the distal RCA with a DES.    3:01 PM: I interpreted/reviewed the labs and/or imaging which were non-contributory.  Normal rectal temp. Pt ambulatory w/out hypoxia. Cr at baseline. HA improved. Pt urinated here, UA neg. He continues to appear well and is feeling mildly better. Delta  trop neg. Sx possibly relate to URI vs viral syndrome.  I have discussed the diagnosis/risks/treatment options with the patient and family and believe the pt to be eligible for discharge home to follow-up with his pcp [facility] 1-2 days. We also discussed returning to the ED immediately if new or worsening sx occur. We discussed the sx which are most concerning (e.g., worsening generalized weakness, fever, vomiting, cp, worsening sob) that necessitate immediate return. Medications administered to the patient during their visit and any new prescriptions provided to the patient are listed below.  Medications given during this visit  Medications  sodium chloride 0.9 % bolus 500 mL (0 mLs Intravenous Stopped 12/14/14 1330)  metoCLOPramide (REGLAN) injection 5 mg (5 mg Intravenous Given 12/14/14 1157)  acetaminophen (TYLENOL) tablet 650 mg (650 mg Oral Given 12/14/14 1156)    New Prescriptions   No medications on file      Pamella Pert, MD 12/14/14 1506  Pamella Pert, MD 12/14/14 1510

## 2014-12-14 NOTE — ED Notes (Signed)
Per EMS: Generalized weakness for approximately one week to one month.  Pt not eating well.  Pt has chronic sob on exertion but has worsened over one week.

## 2014-12-14 NOTE — Discharge Instructions (Signed)
Cough, Adult   A cough is a reflex. It helps you clear your throat and airways. A cough can help heal your body. A cough can last 2 or 3 weeks (acute) or may last more than 8 weeks (chronic). Some common causes of a cough can include an infection, allergy, or a cold.  HOME CARE  · Only take medicine as told by your doctor.  · If given, take your medicines (antibiotics) as told. Finish them even if you start to feel better.  · Use a cold steam vaporizer or humidifier [place] your home. This can help loosen thick spit (secretions).  · Sleep so you are almost sitting up (semi-upright). Use pillows to do this. This helps reduce coughing.  · Rest as needed.  · Stop smoking if you smoke.  GET HELP RIGHT AWAY IF:  · You have yellowish-white fluid (pus) Ramil your thick spit.  · Your cough gets worse.  · Your medicine does not reduce coughing, and you are losing sleep.  · You cough up blood.  · You have trouble breathing.  · Your pain gets worse and medicine does not help.  · You have a fever.  MAKE SURE YOU:   · Understand these instructions.  · Will watch your condition.  · Will get help right away if you are not doing well or get worse.  Document Released: 07/13/2011 Document Revised: 03/16/2014 Document Reviewed: 07/13/2011  [employer]® Patient Information ©2015 [employer]. This information is not intended to replace advice given to you by your health care provider. Make sure you discuss any questions you have with your health care provider.

## 2014-12-28 ENCOUNTER — Ambulatory Visit: Payer: Medicare Other | Admitting: Cardiology

## 2015-01-06 ENCOUNTER — Encounter: Payer: Self-pay | Admitting: Cardiology

## 2016-11-08 DIAGNOSIS — R300 Dysuria: Secondary | ICD-10-CM

## 2016-11-08 HISTORY — DX: Dysuria: R30.0

## 2017-05-07 DIAGNOSIS — Z8673 Personal history of transient ischemic attack (TIA), and cerebral infarction without residual deficits: Secondary | ICD-10-CM

## 2017-05-07 DIAGNOSIS — R4182 Altered mental status, unspecified: Secondary | ICD-10-CM

## 2017-05-07 DIAGNOSIS — R609 Edema, unspecified: Secondary | ICD-10-CM

## 2017-05-07 HISTORY — DX: Personal history of transient ischemic attack (TIA), and cerebral infarction without residual deficits: Z86.73

## 2017-05-07 HISTORY — DX: Edema, unspecified: R60.9

## 2017-05-07 HISTORY — DX: Altered mental status, unspecified: R41.82

## 2017-05-07 LAB — BASIC METABOLIC PANEL
BUN: 43 — AB (ref 4–21)
CREATININE: 2.9 — AB (ref 0.6–1.3)
Glucose: 107
Potassium: 5.2 (ref 3.4–5.3)
Sodium: 139 (ref 137–147)

## 2017-05-07 LAB — CBC AND DIFFERENTIAL
HEMATOCRIT: 34 — AB (ref 41–53)
Hemoglobin: 11.1 — AB (ref 13.5–17.5)
PLATELETS: 180 (ref 150–399)
WBC: 8.2

## 2017-05-07 LAB — HEPATIC FUNCTION PANEL
ALK PHOS: 87 (ref 25–125)
ALT: 10 (ref 10–40)
AST: 36 (ref 14–40)
BILIRUBIN, TOTAL: 0.4

## 2017-05-07 LAB — POCT INR: INR: 1 (ref 0.9–1.1)

## 2017-05-08 LAB — CBC AND DIFFERENTIAL
HEMATOCRIT: 33 — AB (ref 41–53)
HEMOGLOBIN: 10.7 — AB (ref 13.5–17.5)
PLATELETS: 169 (ref 150–399)
WBC: 7.4

## 2017-05-08 LAB — BASIC METABOLIC PANEL
BUN: 43 — AB (ref 4–21)
CREATININE: 2.9 — AB (ref 0.6–1.3)
GLUCOSE: 88
POTASSIUM: 4.9 (ref 3.4–5.3)
Sodium: 142 (ref 137–147)

## 2017-05-08 LAB — HEPATIC FUNCTION PANEL
ALT: 9 — AB (ref 10–40)
AST: 41 — AB (ref 14–40)
Alkaline Phosphatase: 83 (ref 25–125)
Bilirubin, Total: 9.4

## 2017-05-11 ENCOUNTER — Non-Acute Institutional Stay (SKILLED_NURSING_FACILITY): Payer: Medicare Other | Admitting: Internal Medicine

## 2017-05-11 ENCOUNTER — Encounter: Payer: Self-pay | Admitting: Internal Medicine

## 2017-05-11 DIAGNOSIS — F0281 Dementia in other diseases classified elsewhere with behavioral disturbance: Secondary | ICD-10-CM

## 2017-05-11 DIAGNOSIS — G301 Alzheimer's disease with late onset: Secondary | ICD-10-CM | POA: Diagnosis not present

## 2017-05-11 DIAGNOSIS — I63532 Cerebral infarction due to unspecified occlusion or stenosis of left posterior cerebral artery: Secondary | ICD-10-CM

## 2017-05-11 DIAGNOSIS — I10 Essential (primary) hypertension: Secondary | ICD-10-CM | POA: Diagnosis not present

## 2017-05-11 DIAGNOSIS — F02818 Dementia in other diseases classified elsewhere, unspecified severity, with other behavioral disturbance: Secondary | ICD-10-CM

## 2017-05-11 DIAGNOSIS — E785 Hyperlipidemia, unspecified: Secondary | ICD-10-CM | POA: Diagnosis not present

## 2017-05-11 DIAGNOSIS — D508 Other iron deficiency anemias: Secondary | ICD-10-CM

## 2017-05-11 DIAGNOSIS — N184 Chronic kidney disease, stage 4 (severe): Secondary | ICD-10-CM | POA: Diagnosis not present

## 2017-05-11 DIAGNOSIS — N400 Enlarged prostate without lower urinary tract symptoms: Secondary | ICD-10-CM

## 2017-05-11 DIAGNOSIS — K219 Gastro-esophageal reflux disease without esophagitis: Secondary | ICD-10-CM

## 2017-05-11 DIAGNOSIS — N2889 Other specified disorders of kidney and ureter: Secondary | ICD-10-CM

## 2017-05-11 NOTE — Progress Notes (Signed)
: Provider:  Noah Delaine. Sheppard Coil, MD Location:  [place] Room Number: [place]:  SNF (31)  PCP: Rosine Abe, DO Patient Care Team: Rosine Abe, DO as PCP - General  Extended Emergency Contact Information Primary Emergency Contact: [place] Phone: [phone] Work Phone: [phone] Relation: Other     Allergies: Patient has no known allergies.  Chief Complaint  Patient presents with  . New Admit To SNF    following hospitalization 05/07/17 to 05/10/17 acute ischemic left PCA  stroke    HPI: Patient is 83 y.o. male with Dementia, prostate cancer, hypertension, chronic kidney disease stage IV, coronary artery disease who was admitted to [facility] from 6/25-28 with complaints of altered mental status. Patient was evaluated for C VA, and all I showed patient to have a 5 mm acute infarct at the junction of the left medial temporal occipital region, PCA distribution. With this finding neurology was consulted who saw the patient and recommended a workup for stroke and recommended adding aspirin and Lipitor. Patient was also found to have congestive heart failure with a 40% ejection fraction and history of inferior MI Nolberto 2015. Given the patient's comorbidities patient's family i.e. daughter and sister decided on medication management and outpatient follow-up with cardiology patient is admitted to skilled nursing facility with generalized weakness for OT/PT. While at skilled nursing facility patient will be followed for hyperlipidemia treated with Lipitor, hypertension treated with Coreg and Hydralazine, and for dementia treated with Namenda.  Past Medical History:  Diagnosis Date  . Altered mental status, unspecified 05/07/2017  . CAD [place] native artery   . CKD (chronic kidney disease), stage III   . Dementia   . Dysuria 11/08/2016  . Edema 05/07/2017  . History of colonoscopy   . History of ischemic left PCA  stroke 05/07/2017  . Hypertension   . Macrocytic anemia   . Prostate cancer [facility])    Followed by Dr [name] -  Urologist [facility]  . ST elevation myocardial infarction (STEMI) involving right coronary artery [place] recovery phase ([place]) 07/2014   DES to the RCA, 90% OM1 treated medically, EF 50% by echo  . Syncope 01/2010    Past Surgical History:  Procedure Laterality Date  . CATARACT EXTRACTION     x 2   . CORONARY ANGIOPLASTY WITH STENT PLACEMENT  08/06/2014   LAD 50%, OM1  90/80%, RCA 99%, then 60%, treated with a 2.5 x 20 mm Promus stent to the RCA  . LEFT HEART CATHETERIZATION WITH CORONARY ANGIOGRAM N/A 08/06/2014   Procedure: LEFT HEART CATHETERIZATION WITH CORONARY ANGIOGRAM;  Surgeon: [name], MD;  Location: [facility] CATH LAB;  Service: Cardiovascular;  Laterality: N/A;    Allergies as of 05/11/2017   No Known Allergies     Medication List       Accurate as of 05/11/17 11:24 AM. Always use your most recent med list.          aspirin 81 MG EC tablet Take 1 tablet (81 mg total) by mouth daily.   atorvastatin 40 MG tablet Commonly known as:  LIPITOR Take 40 mg by mouth daily. Take one tablet daily   carvedilol 6.25 MG tablet Commonly known as:  COREG Take 1 tablet (6.25 mg total) by mouth 2 (two) times daily with a meal.   eucerin cream Apply topically. Apply topically to feet nightly   hydrALAZINE 10 MG tablet Commonly known as:  APRESOLINE Take 10 mg by mouth. Take one tablet every eight hours   loratadine-pseudoephedrine 10-240 MG 24 hr tablet Commonly known as:  CLARITIN-D 24-hour Take by mouth. Take one tablet daily   memantine 5 MG tablet Commonly known as:  NAMENDA Take 5 mg by mouth. Take one tablet twice daily for memory   mirtazapine 15 MG disintegrating tablet Commonly known as:  REMERON SOL-TAB Take 7.5 mg by mouth. Take one tablet nightly   ranitidine 75 MG tablet Commonly known as:  ZANTAC Take 75 mg by mouth. Take one tablet twice  daily   sertraline 25 MG tablet Commonly known as:  ZOLOFT Take 25 mg by mouth. Take one tablet nightly   SM IRON 325 (65 FE) MG tablet Generic drug:  ferrous sulfate Take 325 mg by mouth. Take one tablet daily for iron   sodium bicarbonate 650 MG tablet Take 650 mg by mouth. Take one tablet daily   tamsulosin 0.4 MG Caps capsule Commonly known as:  FLOMAX Take 0.4 mg by mouth. Take one capsule daily       Meds ordered this encounter  Medications  . atorvastatin (LIPITOR) 40 MG tablet    Sig: Take 40 mg by mouth daily. Take one tablet daily  . hydrALAZINE (APRESOLINE) 10 MG tablet    Sig: Take 10 mg by mouth. Take one tablet every eight hours  . loratadine-pseudoephedrine (CLARITIN-D 24-HOUR) 10-240 MG 24 hr tablet    Sig: Take by mouth. Take one tablet daily  . Skin Protectants, Misc. (EUCERIN) cream    Sig: Apply topically. Apply topically to feet nightly  . ferrous sulfate (SM IRON) 325 (65 FE) MG tablet    Sig: Take 325 mg by mouth. Take one tablet daily for iron  . memantine (NAMENDA) 5 MG tablet    Sig: Take 5 mg by mouth. Take one tablet twice daily for memory  . mirtazapine (REMERON SOL-TAB) 15 MG disintegrating tablet    Sig: Take 7.5 mg by mouth. Take one tablet nightly  . ranitidine (ZANTAC) 75 MG tablet    Sig: Take 75 mg by mouth. Take one tablet twice daily  . sertraline (ZOLOFT) 25 MG tablet    Sig: Take 25 mg by mouth. Take one tablet nightly  . tamsulosin (FLOMAX) 0.4 MG CAPS capsule    Sig: Take 0.4 mg by mouth. Take one capsule daily    Immunization History  Administered Date(s) Administered  . Influenza Whole 08/30/2009, 08/02/2010  . Pneumococcal Polysaccharide-23 08/30/2009    Social History  Substance Use Topics  . Smoking status: Current Every Day Smoker    Packs/day: 0.25    Types: Cigarettes  . Smokeless tobacco: Never Used  . Alcohol use No    Family history is   Family History  Problem Relation Age of Onset  . Cancer Brother         Intestinal Cancer      Review of Systems  DATA OBTAINED: from patient, nurse GENERAL:  no fevers, fatigue, appetite changes SKIN: No itching, or rash EYES: No eye pain, redness, discharge EARS: No earache, tinnitus, change Mohsen hearing NOSE: No congestion, drainage or bleeding  MOUTH/THROAT: No mouth or tooth pain, No sore throat RESPIRATORY: No cough, wheezing, SOB CARDIAC: No chest pain, palpitations, lower extremity edema  GI: No abdominal pain, No N/V/D or constipation, No heartburn or reflux  GU: No dysuria, frequency or urgency, or incontinence  MUSCULOSKELETAL: No unrelieved bone/joint pain NEUROLOGIC: No headache, dizziness or focal weakness  PSYCHIATRIC: No c/o anxiety or sadness   Vitals:   05/11/17 1044  BP: 139/86  Pulse: 78  Resp: 20  Temp: 97 F (36.1 C)    SpO2 Readings from Last 1 Encounters:  05/11/17 97%   Body mass index is 19.2 kg/m.     Physical Exam  GENERAL APPEARANCE: Alert, conversant,  No acute distress.  SKIN: No diaphoresis rash HEAD: Normocephalic, atraumatic  EYES: Conjunctiva/lids clear. Pupils round, reactive. EOMs intact.  EARS: External exam WNL, canals clear. Hearing grossly normal.  NOSE: No deformity or discharge.  MOUTH/THROAT: Lips w/o lesions  RESPIRATORY: Breathing is even, unlabored. Lung sounds are clear   CARDIOVASCULAR: Heart RRR no murmurs, rubs or gallops. No peripheral edema.   GASTROINTESTINAL: Abdomen is soft, non-tender, not distended w/ normal bowel sounds. GENITOURINARY: Bladder non tender, not distended  MUSCULOSKELETAL: No abnormal joints or musculature NEUROLOGIC:  Cranial nerves 2-12 grossly intact. Moves all extremities  PSYCHIATRIC: Mood and affect appropriate to situation, no behavioral issues  Patient Active Problem List   Diagnosis Date Noted  . CAD [place] native artery   . Hypertension   . Hyperkalemia 08/08/2014  . ST elevation myocardial infarction (STEMI) involving right coronary artery [place]  recovery phase ([place]) 08/06/2014  . Renal cysts, acquired, bilateral 02/01/2011  . Chronic renal insufficiency, stage IV (severe) ([place]) 11/02/2010  . HYPERGLYCEMIA 11/02/2010  . Essential hypertension 04/07/2010  . BENIGN PROSTATIC HYPERTROPHY, WITH OBSTRUCTION 04/07/2010  . PROSTATE CANCER, HX OF 04/07/2010      Labs reviewed: Basic Metabolic Panel:    Component Value Date/Time   NA 142 05/08/2017   K 4.9 05/08/2017   CL 109 12/14/2014 1120   CO2 23 12/14/2014 1120   GLUCOSE 149 (H) 12/14/2014 1120   BUN 43 (A) 05/08/2017   CREATININE 2.9 (A) 05/08/2017   CREATININE 2.05 (H) 12/14/2014 1120   CREATININE 2.14 (H) 08/27/2014 0844   CALCIUM 8.9 12/14/2014 1120   PROT 7.7 12/14/2014 1120   ALBUMIN 3.6 12/14/2014 1120   AST 41 (A) 05/08/2017   ALT 9 (A) 05/08/2017   ALKPHOS 83 05/08/2017   BILITOT 0.6 12/14/2014 1120   GFRNONAA 28 (L) 12/14/2014 1120   GFRAA 32 (L) 12/14/2014 1120     Recent Labs  05/07/17 05/08/17  NA 139 142  K 5.2 4.9  BUN 43* 43*  CREATININE 2.9* 2.9*   Liver Function Tests:  Recent Labs  05/07/17 05/08/17  AST 36 41*  ALT 10 9*  ALKPHOS 87 83   No results for input(s): LIPASE, AMYLASE Arsen the last 8760 hours. No results for input(s): AMMONIA Khyri the last 8760 hours. CBC:  Recent Labs  05/07/17 05/08/17  WBC 8.2 7.4  HGB 11.1* 10.7*  HCT 34* 33*  PLT 180 169   Lipid No results for input(s): CHOL, HDL, LDLCALC, TRIG Gevork the last 8760 hours.  Cardiac Enzymes: No results for input(s): CKTOTAL, CKMB, CKMBINDEX, TROPONINI Isami the last 8760 hours. BNP: No results for input(s): BNP Willia the last 8760 hours. No results found for: [facility] Lab Results  Component Value Date   HGBA1C 6.0 (H) 08/06/2014   Lab Results  Component Value Date   TSH 0.884 08/06/2014   No results found for: VITAMINB12 No results found for: FOLATE No results found for: IRON, TIBC, FERRITIN  Imaging and Procedures obtained prior to SNF admission: Dg Chest 2  View  Result Date: 12/14/2014 CLINICAL DATA:  Weakness for a month, recently worsening. EXAM: CHEST  2 VIEW COMPARISON:  08/07/2014 FINDINGS: Chronic pulmonary hyperinflation and biapical lung scarring. Linear scar Grayden the right upper lung stable compared to recent studies. There is an indistinct nodular density between the anterior left fifth and sixth ribs measuring 11 mm. No contralateral nipple shadow noted Leevon the lateral projection. Calcified granuloma Kalieb the right lower lobe. There is no edema, consolidation, effusion (blunting of the posterior costophrenic sulci is likely from hyperinflation), or pneumothorax. Normal heart size and stable aortic tortuosity. IMPRESSION: 1. No acute finding. 2. Questionable 11 mm nodule Barrington the left lower lobe. Recommend nonemergent chest CT given extensive emphysema. Electronically Signed   By: Jorje Guild M.D.   On: 12/14/2014 12:13   Ct Head Wo Contrast  Result Date: 12/14/2014 CLINICAL DATA:  Weakness for 1 month. EXAM: CT HEAD WITHOUT CONTRAST TECHNIQUE: Contiguous axial images were obtained from the base of the skull through the vertex without intravenous contrast. COMPARISON:  None. FINDINGS: The brain is atrophic with extensive hypoattenuation Curlee the subcortical and periventricular deep white matter consistent chronic microvascular ischemic change. Remote lacunar infarction right basal ganglia and left thalamus is identified. No evidence of acute abnormality including infarct, hemorrhage, mass lesion, mass effect, midline shift or abnormal extra-axial fluid collection. No hydrocephalus or pneumocephalus. The calvarium is intact. IMPRESSION: No acute abnormality. Atrophy and extensive chronic microvascular ischemic change. Electronically Signed   By: Inge Rise M.D.   On: 12/14/2014 12:01     Not all labs, radiology exams or other studies done during hospitalization come through on my EPIC note; however they are reviewed by me.    Assessment and  Plan  SMALL stroke Aivan the L PCA distribution-presentation was altered mental status MRI showed patient to have a 5 mm acute infarct. Workup for stroke including echo which showed an ejection fraction of 40% with mild global left ventricular hypokinesis and review of records indicated patient has had an inferior MI Justyn 2015. For risk factor control patient was placed on aspirin and Lipitor, patient is already being treated for hypertension. Rashard discussion with patient's daughter and sister and given patient's comorbidities it was decided for medication management and outpatient follow-up with cardiology patient appeared to have no deficits from this very small stroke. SNF _to skilled nursing facility with generalized weakness for OT/PT  HYPERTENSION- was said to be high Terell the hospital patient was started on hydralazine Ruvim addition to his coreg SNF -  will continue continue hydralazine 10 mg by mouth every 8 hours and Coreg 6.25 mg by mouth twice a day  HYPERLIPIDEMIA SNF - not stated as uncontrolled patient was started on Lipitor 40 mg daily which will be continued  DEMENTIA WITH BEHAVIORS SNF - will continue Namenda 5 mg twice a day and Zoloft 25 mg by mouth daily at bedtime  BPH SNF - continue Flomax 0.4 mg by mouth daily at bedtime  GERD SNF - not stated as uncontrolled, continue Zantac 75 mg twice a day  IRON DEFICIENCY ANEMIA-DC hemoglobin was 10.7 SNF - will follow-up CBC  CKD4 -looks like patient's baseline creatinine is 2.92 SNF - we will follow up BMP      Time spent > 45 min;> 50% of time with patient was spent reviewing records, labs, tests and studies, counseling and developing plan of care Webb Silversmith D. Sheppard Coil, MD

## 2017-05-12 ENCOUNTER — Encounter: Payer: Self-pay | Admitting: Internal Medicine

## 2017-05-12 DIAGNOSIS — D509 Iron deficiency anemia, unspecified: Secondary | ICD-10-CM | POA: Insufficient documentation

## 2017-05-12 DIAGNOSIS — F0391 Unspecified dementia with behavioral disturbance: Secondary | ICD-10-CM | POA: Insufficient documentation

## 2017-05-12 DIAGNOSIS — K219 Gastro-esophageal reflux disease without esophagitis: Secondary | ICD-10-CM | POA: Insufficient documentation

## 2017-05-12 DIAGNOSIS — I63532 Cerebral infarction due to unspecified occlusion or stenosis of left posterior cerebral artery: Secondary | ICD-10-CM | POA: Insufficient documentation

## 2017-05-12 DIAGNOSIS — F03918 Unspecified dementia, unspecified severity, with other behavioral disturbance: Secondary | ICD-10-CM | POA: Insufficient documentation

## 2017-05-12 DIAGNOSIS — E785 Hyperlipidemia, unspecified: Secondary | ICD-10-CM | POA: Insufficient documentation

## 2017-05-15 LAB — CBC AND DIFFERENTIAL
HEMATOCRIT: 35 — AB (ref 41–53)
HEMOGLOBIN: 11.5 — AB (ref 13.5–17.5)
Platelets: 206 (ref 150–399)
WBC: 9.2

## 2017-05-15 LAB — BASIC METABOLIC PANEL
BUN: 57 — AB (ref 4–21)
Creatinine: 3.1 — AB (ref 0.6–1.3)
GLUCOSE: 124
POTASSIUM: 4.8 (ref 3.4–5.3)
SODIUM: 136 — AB (ref 137–147)

## 2017-05-29 ENCOUNTER — Encounter: Payer: Self-pay | Admitting: Internal Medicine

## 2017-05-29 ENCOUNTER — Non-Acute Institutional Stay (SKILLED_NURSING_FACILITY): Payer: Medicare Other | Admitting: Internal Medicine

## 2017-05-29 DIAGNOSIS — K219 Gastro-esophageal reflux disease without esophagitis: Secondary | ICD-10-CM | POA: Diagnosis not present

## 2017-05-29 DIAGNOSIS — F02818 Dementia in other diseases classified elsewhere, unspecified severity, with other behavioral disturbance: Secondary | ICD-10-CM

## 2017-05-29 DIAGNOSIS — I10 Essential (primary) hypertension: Secondary | ICD-10-CM

## 2017-05-29 DIAGNOSIS — F0281 Dementia in other diseases classified elsewhere with behavioral disturbance: Secondary | ICD-10-CM

## 2017-05-29 DIAGNOSIS — E785 Hyperlipidemia, unspecified: Secondary | ICD-10-CM | POA: Diagnosis not present

## 2017-05-29 DIAGNOSIS — I63532 Cerebral infarction due to unspecified occlusion or stenosis of left posterior cerebral artery: Secondary | ICD-10-CM | POA: Diagnosis not present

## 2017-05-29 DIAGNOSIS — G301 Alzheimer's disease with late onset: Secondary | ICD-10-CM

## 2017-05-29 DIAGNOSIS — N184 Chronic kidney disease, stage 4 (severe): Secondary | ICD-10-CM | POA: Diagnosis not present

## 2017-05-29 DIAGNOSIS — D508 Other iron deficiency anemias: Secondary | ICD-10-CM

## 2017-05-29 DIAGNOSIS — N400 Enlarged prostate without lower urinary tract symptoms: Secondary | ICD-10-CM

## 2017-05-29 NOTE — Progress Notes (Signed)
Location:  [place] Room Number: [place]:  SNF [phone])  Provider: Noah Delaine. Sheppard Coil, MD  PCP: Rosine Abe, DO Patient Care Team: Rosine Abe, DO as PCP - General  Extended Emergency Contact Information Primary Emergency Contact: [place] Phone: [phone] Work Phone: [phone] Relation: Other  No Known Allergies  Chief Complaint  Patient presents with  . Discharge Note    discharge from SNF to [facility]    HPI:  83 y.o. male  with Dementia, prostate cancer, hypertension, chronic kidney disease stage IV, coronary artery disease who was admitted to [facility] from 6/25-28 with complaints of altered mental status. Patient was evaluated for C VA, and all I showed patient to have a 5 mm acute infarct at the junction of the left medial temporal occipital region, PCA distribution. With this finding neurology was consulted who saw the patient and recommended a workup for stroke and recommended adding aspirin and Lipitor. Patient was also found to have congestive heart failure with a 40% ejection fraction and history of inferior MI [place] 2015. Given the patient's comorbidities patient's family i.e. daughter and sister decided on medication management and outpatient follow-up with cardiology patient is admitted to skilled nursing facility with generalized weakness for OT/PT. Patient is now ready to be discharged to an ALF memory care unit.    Past Medical History:  Diagnosis Date  . Altered mental status, unspecified 05/07/2017  . CAD [place] native artery   . CKD (chronic kidney disease), stage III   . Dementia   . Dysuria 11/08/2016  . Edema 05/07/2017  . History of colonoscopy   . History of ischemic left PCA stroke 05/07/2017  . Hypertension   . Macrocytic anemia   . Prostate cancer [facility])    Followed by Dr [name] -  Urologist [facility]  . ST elevation myocardial infarction (STEMI) involving  right coronary artery [place] recovery phase ([place]) 07/2014   DES to the RCA, 90% OM1 treated medically, EF 50% by echo  . Syncope 01/2010    Past Surgical History:  Procedure Laterality Date  . CATARACT EXTRACTION     x 2   . CORONARY ANGIOPLASTY WITH STENT PLACEMENT  08/06/2014   LAD 50%, OM1  90/80%, RCA 99%, then 60%, treated with a 2.5 x 20 mm Promus stent to the RCA  . LEFT HEART CATHETERIZATION WITH CORONARY ANGIOGRAM N/A 08/06/2014   Procedure: LEFT HEART CATHETERIZATION WITH CORONARY ANGIOGRAM;  Surgeon: [name], MD;  Location: [facility] CATH LAB;  Service: Cardiovascular;  Laterality: N/A;     reports that he has been smoking Cigarettes.  He has been smoking about 0.25 packs per day. He has never used smokeless tobacco. He reports that he does not drink alcohol or use drugs. Social History   Social History  . Marital status: Married    Spouse name: N/A  . Number of children: N/A  . Years of education: N/A   Occupational History  . Retired Retired   Social History Main Topics  . Smoking status: Current Every Day Smoker    Packs/day: 0.25    Types: Cigarettes  . Smokeless tobacco: Never Used  . Alcohol use No  . Drug use: No  . Sexual activity: Not on file   Other Topics Concern  . Not on file   Social History Narrative   [place]    Wife lives [place], [place] with  daughter)   2 sons ( 1 son Jovontae 47, 1 son lives nearby)  2 daughters   Admitted to [facility] 05/10/17   Divorced   Full code    Pertinent  Health Maintenance Due  Topic Date Due  . PNA vac Low Risk Adult (2 of 2 - PCV13) 08/30/2010  . INFLUENZA VACCINE  06/13/2017    Medications: Allergies as of 05/29/2017   No Known Allergies     Medication List       Accurate as of 05/29/17  2:12 PM. Always use your most recent med list.          acetaminophen 650 MG CR tablet Commonly known as:  TYLENOL Take 650 mg by mouth. Take one every 6 hours as needed for pain   aspirin  81 MG EC tablet Take 1 tablet (81 mg total) by mouth daily.   atorvastatin 40 MG tablet Commonly known as:  LIPITOR Take 40 mg by mouth daily. Take one tablet daily   carvedilol 6.25 MG tablet Commonly known as:  COREG Take 1 tablet (6.25 mg total) by mouth 2 (two) times daily with a meal.   eucerin cream Apply topically. Apply topically to feet nightly   hydrALAZINE 10 MG tablet Commonly known as:  APRESOLINE Take 10 mg by mouth. Take one tablet every eight hours   loratadine-pseudoephedrine 10-240 MG 24 hr tablet Commonly known as:  CLARITIN-D 24-hour Take by mouth. Take one tablet daily   memantine 5 MG tablet Commonly known as:  NAMENDA Take 5 mg by mouth. Take one tablet twice daily for memory   mirtazapine 15 MG disintegrating tablet Commonly known as:  REMERON SOL-TAB Take 7.5 mg by mouth. Take one tablet nightly   NON FORMULARY Med pass 4 oz twice daily   ranitidine 75 MG tablet Commonly known as:  ZANTAC Take 75 mg by mouth. Take one tablet twice daily   sertraline 25 MG tablet Commonly known as:  ZOLOFT Take 25 mg by mouth. Take one tablet nightly   SM IRON 325 (65 FE) MG tablet Generic drug:  ferrous sulfate Take 325 mg by mouth. Take one tablet daily for iron   sodium bicarbonate 650 MG tablet Take 650 mg by mouth. Take one tablet daily   tamsulosin 0.4 MG Caps capsule Commonly known as:  FLOMAX Take 0.4 mg by mouth. Take one capsule daily        Vitals:   05/29/17 1208  BP: 126/87  Pulse: 77  Resp: 12  Temp: 98.1 F (36.7 C)  SpO2: 97%  Weight: 117 lb (53.1 kg)  Height: 5\' 9"  (1.753 m)   Body mass index is 17.28 kg/m.  Physical Exam  GENERAL APPEARANCE: Alert, conversant. No acute distress.  HEENT: Unremarkable. RESPIRATORY: Breathing is even, unlabored. Lung sounds are clear   CARDIOVASCULAR: Heart RRR no murmurs, rubs or gallops. No peripheral edema.  GASTROINTESTINAL: Abdomen is soft, non-tender, not distended w/ normal bowel  sounds.  NEUROLOGIC: Cranial nerves 2-12 grossly intact. Moves all extremities   Labs reviewed: Basic Metabolic Panel:  Recent Labs  05/07/17 05/08/17 05/15/17  NA 139 142 136*  K 5.2 4.9 4.8  BUN 43* 43* 57*  CREATININE 2.9* 2.9* 3.1*   No results found for: [facility] Liver Function Tests:  Recent Labs  05/07/17 05/08/17  AST 36 41*  ALT 10 9*  ALKPHOS 87 83   No results for input(s): LIPASE, AMYLASE Butler the last 8760 hours. No results for  input(s): AMMONIA Edwyn the last 8760 hours. CBC:  Recent Labs  05/07/17 05/08/17 05/15/17  WBC 8.2 7.4 9.2  HGB 11.1* 10.7* 11.5*  HCT 34* 33* 35*  PLT 180 169 206   Lipid No results for input(s): CHOL, HDL, LDLCALC, TRIG Amar the last 8760 hours. Cardiac Enzymes: No results for input(s): CKTOTAL, CKMB, CKMBINDEX, TROPONINI [place]lo[place] the last 8760 hours. BNP: No results for input(s): BNP Langston the last 8760 hours. CBG: No results for input(s): GLUCAP Dymir the last 8760 hours.  Procedures and Imaging Studies During Stay: No results found.  Assessment/Plan:   Acute ischemic left posterior cerebral artery (PCA) stroke (HCC)  Essential hypertension  Late onset Alzheimer's disease with behavioral disturbance  Hyperlipidemia, unspecified hyperlipidemia type  Gastroesophageal reflux disease without esophagitis  Chronic renal insufficiency, stage IV (severe) (HCC)  Benign prostatic hyperplasia without lower urinary tract symptoms  Iron deficiency anemia secondary to inadequate dietary iron intake   Patient is being discharged with the following home health services:  OT/PT/Nursing  Patient is being discharged with the following durable medical equipment:    Patient has been advised to f/u with their PCP Ada 1-2 weeks to bring them up to date on their rehab stay.  Social services at facility was responsible for arranging this appointment.  Pt was provided with a 30 day supply of prescriptions for medications and refills must be obtained  from their PCP.  For controlled substances, a more limited supply may be provided adequate until PCP appointment only.  Medications have been reconciled.    Time spent > 30 min;> 50% of time with patient was spent reviewing records, labs, tests and studies, counseling and developing plan of care  Noah Delaine. Sheppard Coil, MD

## 2017-09-05 ENCOUNTER — Emergency Department (HOSPITAL_BASED_OUTPATIENT_CLINIC_OR_DEPARTMENT_OTHER): Payer: Medicare Other

## 2017-09-05 ENCOUNTER — Emergency Department (HOSPITAL_BASED_OUTPATIENT_CLINIC_OR_DEPARTMENT_OTHER)
Admission: EM | Admit: 2017-09-05 | Discharge: 2017-09-05 | Disposition: A | Discharge status: Home / Self Care | Payer: Medicare Other | Attending: Emergency Medicine | Admitting: Emergency Medicine

## 2017-09-05 ENCOUNTER — Encounter (HOSPITAL_BASED_OUTPATIENT_CLINIC_OR_DEPARTMENT_OTHER): Payer: Self-pay | Admitting: Emergency Medicine

## 2017-09-05 DIAGNOSIS — S0083XA Contusion of other part of head, initial encounter: Secondary | ICD-10-CM | POA: Diagnosis not present

## 2017-09-05 DIAGNOSIS — Z87891 Personal history of nicotine dependence: Secondary | ICD-10-CM | POA: Insufficient documentation

## 2017-09-05 DIAGNOSIS — S0990XA Unspecified injury of head, initial encounter: Secondary | ICD-10-CM | POA: Diagnosis present

## 2017-09-05 DIAGNOSIS — N183 Chronic kidney disease, stage 3 (moderate): Secondary | ICD-10-CM | POA: Insufficient documentation

## 2017-09-05 DIAGNOSIS — Z8546 Personal history of malignant neoplasm of prostate: Secondary | ICD-10-CM | POA: Insufficient documentation

## 2017-09-05 DIAGNOSIS — W19XXXA Unspecified fall, initial encounter: Secondary | ICD-10-CM | POA: Insufficient documentation

## 2017-09-05 DIAGNOSIS — Y999 Unspecified external cause status: Secondary | ICD-10-CM | POA: Insufficient documentation

## 2017-09-05 DIAGNOSIS — Y929 Unspecified place or not applicable: Secondary | ICD-10-CM | POA: Insufficient documentation

## 2017-09-05 DIAGNOSIS — Z79899 Other long term (current) drug therapy: Secondary | ICD-10-CM | POA: Diagnosis not present

## 2017-09-05 DIAGNOSIS — I129 Hypertensive chronic kidney disease with stage 1 through stage 4 chronic kidney disease, or unspecified chronic kidney disease: Secondary | ICD-10-CM | POA: Diagnosis not present

## 2017-09-05 DIAGNOSIS — Z7982 Long term (current) use of aspirin: Secondary | ICD-10-CM | POA: Insufficient documentation

## 2017-09-05 DIAGNOSIS — I2510 Atherosclerotic heart disease of native coronary artery without angina pectoris: Secondary | ICD-10-CM | POA: Diagnosis not present

## 2017-09-05 DIAGNOSIS — Z955 Presence of coronary angioplasty implant and graft: Secondary | ICD-10-CM | POA: Insufficient documentation

## 2017-09-05 DIAGNOSIS — I252 Old myocardial infarction: Secondary | ICD-10-CM | POA: Insufficient documentation

## 2017-09-05 DIAGNOSIS — Y939 Activity, unspecified: Secondary | ICD-10-CM | POA: Diagnosis not present

## 2017-09-05 NOTE — Discharge Instructions (Signed)
Your imaging did not show evidence of injury inside your head.  You may have had a concussion however your exam was reassuring today.  We feel you are safe for discharge home.  He did not want any other imaging or lab testing or workup today as you feel at her baseline.  If any symptoms change or worsen, please return to the nearest emergency department.  Please follow-up with your primary care physician.

## 2017-09-05 NOTE — ED Provider Notes (Signed)
[facility] EMERGENCY DEPARTMENT Provider Note   CSN: [id number] Arrival date & time: 09/05/17  1638     History   Chief Complaint Chief Complaint  Patient presents with  . Fall    HPI Fred Griffin is a 83 y.o. male.  The history is provided by the patient, medical records and a relative. The history is limited by a language barrier. A language interpreter was used.  Fall  This is a new problem. The current episode started yesterday. The problem occurs rarely. The problem has been resolved. Pertinent negatives include no chest pain, no abdominal pain, no headaches and no shortness of breath. Nothing aggravates the symptoms. Nothing relieves the symptoms. He has tried nothing for the symptoms. The treatment provided no relief.    Past Medical History:  Diagnosis Date  . Altered mental status, unspecified 05/07/2017  . CAD [name] native artery   . CKD (chronic kidney disease), stage III ([place])   . Dementia   . Dysuria 11/08/2016  . Edema 05/07/2017  . History of colonoscopy   . History of ischemic left PCA stroke 05/07/2017  . Hypertension   . Macrocytic anemia   . Prostate cancer [facility])    Followed by Dr [name] -  Urologist [facility]  . ST elevation myocardial infarction (STEMI) involving right coronary artery [name] recovery phase ([place]) 07/2014   DES to the RCA, 90% OM1 treated medically, EF 50% by echo  . Syncope 01/2010    Patient Active Problem List   Diagnosis Date Noted  . Acute ischemic left posterior cerebral artery (PCA) stroke ([place]) 05/12/2017  . HLD (hyperlipidemia) 05/12/2017  . Dementia with behavioral disturbance 05/12/2017  . GERD (gastroesophageal reflux disease) 05/12/2017  . Iron deficiency anemia 05/12/2017  . CAD [name] native artery   . Hypertension   . Hyperkalemia 08/08/2014  . ST elevation myocardial infarction (STEMI) involving right coronary artery [name] recovery phase ([place]) 08/06/2014  . Renal cysts, acquired, bilateral 02/01/2011  . Chronic  renal insufficiency, stage IV (severe) ([place]) 11/02/2010  . HYPERGLYCEMIA 11/02/2010  . Essential hypertension 04/07/2010  . BPH (benign prostatic hyperplasia) 04/07/2010  . PROSTATE CANCER, HX OF 04/07/2010    Past Surgical History:  Procedure Laterality Date  . CATARACT EXTRACTION     x 2   . CORONARY ANGIOPLASTY WITH STENT PLACEMENT  08/06/2014   LAD 50%, OM1  90/80%, RCA 99%, then 60%, treated with a 2.5 x 20 mm Promus stent to the RCA  . LEFT HEART CATHETERIZATION WITH CORONARY ANGIOGRAM N/A 08/06/2014   Procedure: LEFT HEART CATHETERIZATION WITH CORONARY ANGIOGRAM;  Surgeon: [name], MD;  Location: [facility] CATH LAB;  Service: Cardiovascular;  Laterality: N/A;       Home Medications    Prior to Admission medications   Medication Sig Start Date End Date Taking? Authorizing Provider  acetaminophen (TYLENOL) 650 MG CR tablet Take 650 mg by mouth. Take one every 6 hours as needed for pain    [provider]  aspirin EC 81 MG EC tablet Take 1 tablet (81 mg total) by mouth daily. 08/10/14   Barrett, [name], PA-C  atorvastatin (LIPITOR) 40 MG tablet Take 40 mg by mouth daily. Take one tablet daily    [provider]  carvedilol (COREG) 6.25 MG tablet Take 1 tablet (6.25 mg total) by mouth 2 (two) times daily with a meal. 08/10/14   Barrett, [name], PA-C  ferrous sulfate (SM IRON) 325 (65 FE) MG tablet Take  325 mg by mouth. Take one tablet daily for iron    [provider]  hydrALAZINE (APRESOLINE) 10 MG tablet Take 10 mg by mouth. Take one tablet every eight hours 05/10/17 06/09/17  [provider]  loratadine-pseudoephedrine (CLARITIN-D 24-HOUR) 10-240 MG 24 hr tablet Take by mouth. Take one tablet daily    [provider]  memantine (NAMENDA) 5 MG tablet Take 5 mg by mouth. Take one tablet twice daily for memory    [provider]  mirtazapine (REMERON SOL-TAB) 15 MG disintegrating tablet Take 7.5 mg by mouth. Take one tablet  nightly    [provider]  NON FORMULARY Med pass 4 oz twice daily    [provider]  ranitidine (ZANTAC) 75 MG tablet Take 75 mg by mouth. Take one tablet twice daily    [provider]  sertraline (ZOLOFT) 25 MG tablet Take 25 mg by mouth. Take one tablet nightly    [provider]  Skin Protectants, Misc. (EUCERIN) cream Apply topically. Apply topically to feet nightly    [provider]  sodium bicarbonate 650 MG tablet Take 650 mg by mouth. Take one tablet daily    [provider]  tamsulosin (FLOMAX) 0.4 MG CAPS capsule Take 0.4 mg by mouth. Take one capsule daily 05/20/14   [provider]    Family History Family History  Problem Relation Age of Onset  . Cancer Brother        Intestinal Cancer    Social History Social History  Substance Use Topics  . Smoking status: Former Smoker    Packs/day: 0.25    Types: Cigarettes  . Smokeless tobacco: Never Used  . Alcohol use No     Allergies   Patient has no known allergies.   Review of Systems Review of Systems  Constitutional: Negative for chills, diaphoresis, fatigue and fever.  HENT: Negative for congestion and rhinorrhea.   Eyes: Negative for photophobia and visual disturbance.  Respiratory: Negative for apnea, cough and shortness of breath.   Cardiovascular: Negative for chest pain, palpitations and leg swelling.  Gastrointestinal: Negative for abdominal pain, constipation, diarrhea, nausea and vomiting.  Genitourinary: Negative for dysuria and flank pain.  Musculoskeletal: Negative for back pain, neck pain and neck stiffness.  Skin: Negative for rash and wound.  Neurological: Negative for syncope, weakness, light-headedness, numbness and headaches.  Psychiatric/Behavioral: Negative for confusion.  All other systems reviewed and are negative.    Physical Exam Updated Vital Signs BP (!) 173/97 (BP Location: Right Arm)   Pulse 68   Temp 98.2 F (36.8  C) (Oral)   Resp 16   SpO2 98%   Physical Exam  Constitutional: He is oriented to person, place, and time. He appears well-developed and well-nourished. No distress.  HENT:  Head: Head is with abrasion and with contusion. Head is without raccoon's eyes, without Battle's sign and without laceration.    Right Ear: External ear normal.  Left Ear: External ear normal.  Nose: Nose normal.  Mouth/Throat: Oropharynx is clear and moist. No oropharyngeal exudate.  Eyes: Pupils are equal, round, and reactive to light. Conjunctivae and EOM are normal.  Neck: Normal range of motion.  Cardiovascular: Normal rate and intact distal pulses.   No murmur heard. Pulmonary/Chest: Effort normal and breath sounds normal. No stridor. No respiratory distress. He has no wheezes. He exhibits no tenderness.  Abdominal: There is no tenderness.  Musculoskeletal: He exhibits no tenderness.  Neurological: He is alert and oriented to  person, place, and time. No cranial nerve deficit or sensory deficit. He exhibits normal muscle tone. Coordination normal.  Skin: Capillary refill takes less than 2 seconds. He is not diaphoretic. No erythema. No pallor.  Psychiatric: He has a normal mood and affect.  Nursing note and vitals reviewed.    ED Treatments / Results  Labs (all labs ordered are listed, but only abnormal results are displayed) Labs Reviewed - No data to display  EKG  EKG Interpretation None       Radiology Ct Head Wo Contrast  Result Date: 09/05/2017 CLINICAL DATA:  83 year old male with acute fall and head injury. Initial encounter. EXAM: CT HEAD WITHOUT CONTRAST TECHNIQUE: Contiguous axial images were obtained from the base of the skull through the vertex without intravenous contrast. COMPARISON:  05/08/2017 MR, 05/07/2017 CT and prior exams FINDINGS: Brain: No evidence of acute infarction, hemorrhage, hydrocephalus, extra-axial collection or mass lesion/mass effect. Atrophy and chronic  small-vessel white matter ischemic changes again noted. Vascular: Intracranial atherosclerotic calcifications noted. Skull: Normal. Negative for fracture or focal lesion. Sinuses/Orbits: No acute finding. Other: Mild left forehead soft tissue swelling noted. IMPRESSION: 1. No evidence of acute intracranial abnormality 2. Left forehead soft tissue swelling without fracture 3. Atrophy and chronic small-vessel white matter ischemic changes. Electronically Signed   By: Margarette Canada M.D.   On: 09/05/2017 18:13    Procedures Procedures (including critical care time)  Medications Ordered [place] ED Medications - No data to display   Initial Impression / Assessment and Plan / ED Course  I have reviewed the triage vital signs and the nursing notes.  Pertinent labs & imaging results that were available during my care of the patient were reviewed by me and considered Evens my medical decision making (see chart for details).     Fred Griffin is a 83 y.o. male with a past medical history significant for hypertension, chronic kidney disease, prior MI, CAD, dementia, and GERD who presents with a fall.  Patient is accompanied by sister who is able to interpret and speaks [place] well.  Patient says that he had a fall one year ago hitting his head and been fine since then.  He reports that he fell yesterday hitting his left forehead on a dresser.  She denies falling today.  He reports that after he was evaluated at his facility today, they insisted he come to the emergency department for evaluation.  He denies any symptoms.  He specifically denies headache, vision changes, nausea, vomiting, neck pain, neck stiffness, chest pain, shortness of breath, cough, congestion, abdominal pain, urinary symptoms, or GI symptoms.  He denies hitting any other parts of his body including his extremities.  He reports it was a mechanical fall.  On exam, patient has bruising Younis his left forehead and left cheek.  No tenderness.  Normal ocular  movements.  Speech was are symmetric and reactive bilaterally.  Symmetric smile.  Neck was nontender with full range of motion.  No focal neurologic deficits.  Lungs clear and chest and back were nontender.  Exam is reassuring aside from the bruising on the forehead.  Given patient's age and fall, patient will have CT of the head to look for intracranial injury however given reassuring workup, suspect patient will be stable for discharge home if imaging is reassuring.  Anticipate reassessment after image.  CT imaging showed no evidence of intracranial injury.  There was some soft tissue swelling without fracture seen.  Chronic white matter disease was seen however  no new injuries were appreciated.  Based on lack of symptoms and reassuring imaging, patient will be discharged home.  Patient and sister agree with plan of care.  Patient has no other questions or concerns and was discharged Fred Griffin good condition.  Patient will follow-up with PCP and understood return precautions.   Final Clinical Impressions(s) / ED Diagnoses   Final diagnoses:  Fall, initial encounter  Contusion of face, initial encounter  Injury of head, initial encounter    New Prescriptions New Prescriptions   No medications on file    Clinical Impression: 1. Fall, initial encounter   2. Contusion of face, initial encounter   3. Injury of head, initial encounter     Disposition: Discharge  Condition: Good  I have discussed the results, Dx and Tx plan with the pt(& family if present). He/she/they expressed understanding and agree(s) with the plan. Discharge instructions discussed at great length. Strict return precautions discussed and pt &/or family have verbalized understanding of the instructions. No further questions at time of discharge.    New Prescriptions   No medications on file    Follow Up: [name], [name], DO  Schedule an appointment as soon as possible for a visit    [place] [address] [id number] Eula Fried [place] [phone]  If symptoms worsen     Shadana Pry, Gwenyth Allegra, MD 09/05/17 2256

## 2017-09-05 NOTE — ED Triage Notes (Addendum)
Quarter sized discoloration to left forehead that is purple, brown and yellow.  Fall was unwitnessed and it is not certain that pt fell today.  Injury not consistent with fresh bruising. Sts he hit his head on a dresser. Per Pt via interpreter, he sts he fell yesterday morning. Pt ambulatory with walker [place] hallway upon EMS arrival.  Pt speaks primarily [place]. No other complaints.

## 2017-12-20 ENCOUNTER — Inpatient Hospital Stay (HOSPITAL_COMMUNITY)
Admission: EM | Admit: 2017-12-20 | Discharge: 2017-12-22 | DRG: 309 | Disposition: A | Discharge status: Nursing Home (Includes ICF) | Payer: Medicare Other | Attending: Internal Medicine | Admitting: Internal Medicine

## 2017-12-20 ENCOUNTER — Inpatient Hospital Stay (HOSPITAL_COMMUNITY): Payer: Medicare Other

## 2017-12-20 ENCOUNTER — Encounter (HOSPITAL_COMMUNITY): Payer: Self-pay

## 2017-12-20 ENCOUNTER — Emergency Department (HOSPITAL_COMMUNITY): Payer: Medicare Other

## 2017-12-20 DIAGNOSIS — Z87891 Personal history of nicotine dependence: Secondary | ICD-10-CM | POA: Diagnosis not present

## 2017-12-20 DIAGNOSIS — I4891 Unspecified atrial fibrillation: Secondary | ICD-10-CM | POA: Diagnosis present

## 2017-12-20 DIAGNOSIS — I5022 Chronic systolic (congestive) heart failure: Secondary | ICD-10-CM | POA: Diagnosis not present

## 2017-12-20 DIAGNOSIS — E875 Hyperkalemia: Secondary | ICD-10-CM | POA: Diagnosis present

## 2017-12-20 DIAGNOSIS — F0390 Unspecified dementia without behavioral disturbance: Secondary | ICD-10-CM | POA: Diagnosis present

## 2017-12-20 DIAGNOSIS — Z955 Presence of coronary angioplasty implant and graft: Secondary | ICD-10-CM | POA: Diagnosis not present

## 2017-12-20 DIAGNOSIS — I130 Hypertensive heart and chronic kidney disease with heart failure and stage 1 through stage 4 chronic kidney disease, or unspecified chronic kidney disease: Secondary | ICD-10-CM | POA: Diagnosis present

## 2017-12-20 DIAGNOSIS — Z8546 Personal history of malignant neoplasm of prostate: Secondary | ICD-10-CM | POA: Diagnosis not present

## 2017-12-20 DIAGNOSIS — Z8673 Personal history of transient ischemic attack (TIA), and cerebral infarction without residual deficits: Secondary | ICD-10-CM | POA: Diagnosis not present

## 2017-12-20 DIAGNOSIS — Z79899 Other long term (current) drug therapy: Secondary | ICD-10-CM | POA: Diagnosis not present

## 2017-12-20 DIAGNOSIS — E785 Hyperlipidemia, unspecified: Secondary | ICD-10-CM | POA: Diagnosis present

## 2017-12-20 DIAGNOSIS — I480 Paroxysmal atrial fibrillation: Principal | ICD-10-CM | POA: Diagnosis present

## 2017-12-20 DIAGNOSIS — I252 Old myocardial infarction: Secondary | ICD-10-CM | POA: Diagnosis not present

## 2017-12-20 DIAGNOSIS — I5042 Chronic combined systolic (congestive) and diastolic (congestive) heart failure: Secondary | ICD-10-CM | POA: Diagnosis present

## 2017-12-20 DIAGNOSIS — Z7982 Long term (current) use of aspirin: Secondary | ICD-10-CM | POA: Diagnosis not present

## 2017-12-20 DIAGNOSIS — I351 Nonrheumatic aortic (valve) insufficiency: Secondary | ICD-10-CM

## 2017-12-20 DIAGNOSIS — N400 Enlarged prostate without lower urinary tract symptoms: Secondary | ICD-10-CM | POA: Diagnosis present

## 2017-12-20 DIAGNOSIS — D539 Nutritional anemia, unspecified: Secondary | ICD-10-CM | POA: Diagnosis not present

## 2017-12-20 DIAGNOSIS — R748 Abnormal levels of other serum enzymes: Secondary | ICD-10-CM | POA: Diagnosis not present

## 2017-12-20 DIAGNOSIS — N184 Chronic kidney disease, stage 4 (severe): Secondary | ICD-10-CM | POA: Diagnosis present

## 2017-12-20 DIAGNOSIS — R55 Syncope and collapse: Secondary | ICD-10-CM | POA: Diagnosis present

## 2017-12-20 DIAGNOSIS — I2510 Atherosclerotic heart disease of native coronary artery without angina pectoris: Secondary | ICD-10-CM | POA: Diagnosis present

## 2017-12-20 DIAGNOSIS — I959 Hypotension, unspecified: Secondary | ICD-10-CM | POA: Diagnosis present

## 2017-12-20 HISTORY — DX: Cerebral infarction, unspecified: I63.9

## 2017-12-20 HISTORY — DX: Hyperlipidemia, unspecified: E78.5

## 2017-12-20 HISTORY — DX: Chronic combined systolic (congestive) and diastolic (congestive) heart failure: I50.42

## 2017-12-20 LAB — CBC
HEMATOCRIT: 29 % — AB (ref 39.0–52.0)
Hemoglobin: 9.4 g/dL — ABNORMAL LOW (ref 13.0–17.0)
MCH: 31.5 pg (ref 26.0–34.0)
MCHC: 32.4 g/dL (ref 30.0–36.0)
MCV: 97.3 fL (ref 78.0–100.0)
Platelets: 203 10*3/uL (ref 150–400)
RBC: 2.98 MIL/uL — ABNORMAL LOW (ref 4.22–5.81)
RDW: 14.4 % (ref 11.5–15.5)
WBC: 5.9 10*3/uL (ref 4.0–10.5)

## 2017-12-20 LAB — ECHOCARDIOGRAM COMPLETE
HEIGHTINCHES: 68 in
WEIGHTICAEL: 2080 [oz_av]

## 2017-12-20 LAB — FERRITIN: Ferritin: 247 ng/mL (ref 24–336)

## 2017-12-20 LAB — BASIC METABOLIC PANEL
ANION GAP: 5 (ref 5–15)
ANION GAP: 5 (ref 5–15)
BUN: 30 mg/dL — ABNORMAL HIGH (ref 6–20)
BUN: 31 mg/dL — ABNORMAL HIGH (ref 6–20)
CALCIUM: 7.3 mg/dL — AB (ref 8.9–10.3)
CALCIUM: 8.4 mg/dL — AB (ref 8.9–10.3)
CO2: 19 mmol/L — ABNORMAL LOW (ref 22–32)
CO2: 21 mmol/L — ABNORMAL LOW (ref 22–32)
CREATININE: 2.96 mg/dL — AB (ref 0.61–1.24)
Chloride: 116 mmol/L — ABNORMAL HIGH (ref 101–111)
Chloride: 115 mmol/L — ABNORMAL HIGH (ref 101–111)
Creatinine, Ser: 3.16 mg/dL — ABNORMAL HIGH (ref 0.61–1.24)
GFR, EST AFRICAN AMERICAN: 20 mL/min — AB (ref >60)
GFR, EST AFRICAN AMERICAN: 19 mL/min — AB (ref >60)
GFR, EST NON AFRICAN AMERICAN: 18 mL/min — AB (ref >60)
GFR, EST NON AFRICAN AMERICAN: 16 mL/min — AB (ref >60)
Glucose, Bld: 105 mg/dL — ABNORMAL HIGH (ref 65–99)
Glucose, Bld: 50 mg/dL — ABNORMAL LOW (ref 65–99)
Potassium: 5.3 mmol/L — ABNORMAL HIGH (ref 3.5–5.1)
Potassium: 3.9 mmol/L (ref 3.5–5.1)
Sodium: 140 mmol/L (ref 135–145)
Sodium: 141 mmol/L (ref 135–145)

## 2017-12-20 LAB — IRON AND TIBC
Iron: 50 ug/dL (ref 45–182)
Saturation Ratios: 26 % (ref 17.9–39.5)
TIBC: 196 ug/dL — ABNORMAL LOW (ref 250–450)
UIBC: 146 ug/dL

## 2017-12-20 LAB — COMPREHENSIVE METABOLIC PANEL
ALBUMIN: 3.1 g/dL — AB (ref 3.5–5.0)
ALT: 12 U/L — AB (ref 17–63)
AST: 26 U/L (ref 15–41)
Alkaline Phosphatase: 64 U/L (ref 38–126)
Anion gap: 6 (ref 5–15)
BUN: 33 mg/dL — AB (ref 6–20)
CHLORIDE: 112 mmol/L — AB (ref 101–111)
CO2: 21 mmol/L — AB (ref 22–32)
CREATININE: 3.21 mg/dL — AB (ref 0.61–1.24)
Calcium: 8 mg/dL — ABNORMAL LOW (ref 8.9–10.3)
GFR calc Af Amer: 18 mL/min — ABNORMAL LOW (ref >60)
GFR calc non Af Amer: 16 mL/min — ABNORMAL LOW (ref >60)
Glucose, Bld: 115 mg/dL — ABNORMAL HIGH (ref 65–99)
Potassium: 5.8 mmol/L — ABNORMAL HIGH (ref 3.5–5.1)
SODIUM: 139 mmol/L (ref 135–145)
Total Bilirubin: 0.6 mg/dL (ref 0.3–1.2)
Total Protein: 6.6 g/dL (ref 6.5–8.1)

## 2017-12-20 LAB — URINALYSIS, ROUTINE W REFLEX MICROSCOPIC
Bilirubin Urine: NEGATIVE
Glucose, UA: 50 mg/dL — AB
HGB URINE DIPSTICK: NEGATIVE
Ketones, ur: NEGATIVE mg/dL
Leukocytes, UA: NEGATIVE
NITRITE: NEGATIVE
Protein, ur: 100 mg/dL — AB
Specific Gravity, Urine: 1.014 (ref 1.005–1.030)
pH: 6 (ref 5.0–8.0)

## 2017-12-20 LAB — PROTIME-INR
INR: 1.12
PROTHROMBIN TIME: 14.3 s (ref 11.4–15.2)

## 2017-12-20 LAB — RETICULOCYTES
RBC.: 2.94 MIL/uL — ABNORMAL LOW (ref 4.22–5.81)
Retic Count, Absolute: 35.3 10*3/uL (ref 19.0–186.0)
Retic Ct Pct: 1.2 % (ref 0.4–3.1)

## 2017-12-20 LAB — CBG MONITORING, ED: GLUCOSE-CAPILLARY: 93 mg/dL (ref 65–99)

## 2017-12-20 LAB — VITAMIN B12: VITAMIN B 12: 401 pg/mL (ref 180–914)

## 2017-12-20 LAB — MAGNESIUM: Magnesium: 1.8 mg/dL (ref 1.7–2.4)

## 2017-12-20 LAB — TROPONIN I
Troponin I: 0.54 ng/mL (ref <0.03)
Troponin I: 0.55 ng/mL (ref <0.03)
Troponin I: 0.39 ng/mL (ref <0.03)

## 2017-12-20 LAB — FOLATE: FOLATE: 10.3 ng/mL (ref >5.9)

## 2017-12-20 LAB — HEPARIN LEVEL (UNFRACTIONATED): Heparin Unfractionated: 0.85 IU/mL — ABNORMAL HIGH (ref 0.30–0.70)

## 2017-12-20 LAB — APTT: aPTT: 32 seconds (ref 24–36)

## 2017-12-20 LAB — TSH: TSH: 0.732 u[IU]/mL (ref 0.350–4.500)

## 2017-12-20 MED ORDER — WARFARIN - PHARMACIST DOSING INPATIENT: Freq: Every day | Status: DC

## 2017-12-20 MED ORDER — HEPARIN (PORCINE) IN NACL 100-0.45 UNIT/ML-% IJ SOLN
700.0000 [IU]/h | INTRAMUSCULAR | Status: DC
  Filled 2017-12-20: qty 250

## 2017-12-20 MED ORDER — ONDANSETRON HCL 4 MG/2ML IJ SOLN: 4.0000 mg | Freq: Four times a day (QID) | INTRAMUSCULAR | Status: DC | PRN

## 2017-12-20 MED ORDER — SERTRALINE HCL 25 MG PO TABS
25.0000 mg | ORAL_TABLET | Freq: Every day | ORAL | Status: DC
  Administered 2017-12-20 – 2017-12-22 (×3): 25 mg via ORAL
  Filled 2017-12-20 (×3): qty 1

## 2017-12-20 MED ORDER — SODIUM POLYSTYRENE SULFONATE PO POWD
30.0000 g | Freq: Once | ORAL | Status: AC
  Administered 2017-12-20: 30 g via ORAL
  Filled 2017-12-20: qty 30

## 2017-12-20 MED ORDER — DEXTROSE 50 % IV SOLN
25.0000 mL | Freq: Once | INTRAVENOUS | Status: AC
  Administered 2017-12-20: 25 mL via INTRAVENOUS
  Filled 2017-12-20: qty 50

## 2017-12-20 MED ORDER — INSULIN ASPART 100 UNIT/ML IV SOLN
10.0000 [IU] | Freq: Once | INTRAVENOUS | Status: AC
  Administered 2017-12-20: 10 [IU] via INTRAVENOUS
  Filled 2017-12-20: qty 0.1

## 2017-12-20 MED ORDER — FAMOTIDINE 20 MG PO TABS
20.0000 mg | ORAL_TABLET | Freq: Every day | ORAL | Status: DC
  Administered 2017-12-20 – 2017-12-22 (×3): 20 mg via ORAL
  Filled 2017-12-20 (×3): qty 1

## 2017-12-20 MED ORDER — WARFARIN SODIUM 4 MG PO TABS
4.0000 mg | ORAL_TABLET | Freq: Once | ORAL | Status: AC
  Administered 2017-12-20: 4 mg via ORAL
  Filled 2017-12-20: qty 1

## 2017-12-20 MED ORDER — FERROUS SULFATE 325 (65 FE) MG PO TABS
325.0000 mg | ORAL_TABLET | Freq: Every day | ORAL | Status: DC
  Administered 2017-12-20 – 2017-12-22 (×3): 325 mg via ORAL
  Filled 2017-12-20 (×4): qty 1

## 2017-12-20 MED ORDER — HYDRALAZINE HCL 20 MG/ML IJ SOLN: 10.0000 mg | Freq: Four times a day (QID) | INTRAMUSCULAR | Status: DC | PRN

## 2017-12-20 MED ORDER — HEPARIN BOLUS VIA INFUSION
3000.0000 [IU] | Freq: Once | INTRAVENOUS | Status: AC
  Administered 2017-12-20: 3000 [IU] via INTRAVENOUS
  Filled 2017-12-20: qty 3000

## 2017-12-20 MED ORDER — ASPIRIN 81 MG PO TBEC: 81.0000 mg | DELAYED_RELEASE_TABLET | Freq: Every day | ORAL | Status: DC

## 2017-12-20 MED ORDER — ACETAMINOPHEN 325 MG PO TABS: 650.0000 mg | ORAL_TABLET | ORAL | Status: DC | PRN

## 2017-12-20 MED ORDER — SODIUM POLYSTYRENE SULFONATE 15 GM/60ML PO SUSP: 30.0000 g | Freq: Once | ORAL | Status: DC

## 2017-12-20 MED ORDER — MIRTAZAPINE 15 MG PO TBDP
7.5000 mg | ORAL_TABLET | Freq: Every day | ORAL | Status: DC
  Administered 2017-12-21 (×2): 7.5 mg via ORAL
  Filled 2017-12-20 (×3): qty 0.5

## 2017-12-20 MED ORDER — ELDERTONIC PO ELIX
15.0000 mL | ORAL_SOLUTION | Freq: Every day | ORAL | Status: DC
  Administered 2017-12-20 – 2017-12-22 (×3): 15 mL via ORAL
  Filled 2017-12-20 (×4): qty 15

## 2017-12-20 MED ORDER — CARVEDILOL 6.25 MG PO TABS
6.2500 mg | ORAL_TABLET | Freq: Two times a day (BID) | ORAL | Status: DC
  Administered 2017-12-20 – 2017-12-22 (×4): 6.25 mg via ORAL
  Filled 2017-12-20 (×7): qty 1

## 2017-12-20 MED ORDER — ATORVASTATIN CALCIUM 40 MG PO TABS
40.0000 mg | ORAL_TABLET | Freq: Every day | ORAL | Status: DC
  Administered 2017-12-20 – 2017-12-22 (×3): 40 mg via ORAL
  Filled 2017-12-20 (×3): qty 1

## 2017-12-20 MED ORDER — SODIUM BICARBONATE 650 MG PO TABS
650.0000 mg | ORAL_TABLET | Freq: Every day | ORAL | Status: DC
  Administered 2017-12-20 – 2017-12-22 (×3): 650 mg via ORAL
  Filled 2017-12-20 (×3): qty 1

## 2017-12-20 MED ORDER — SODIUM CHLORIDE 0.9 % IV SOLN
1.0000 g | Freq: Once | INTRAVENOUS | Status: AC
  Administered 2017-12-20: 1 g via INTRAVENOUS
  Filled 2017-12-20: qty 10

## 2017-12-20 MED ORDER — SODIUM CHLORIDE 0.9 % IV BOLUS (SEPSIS)
500.0000 mL | Freq: Once | INTRAVENOUS | Status: AC
  Administered 2017-12-20: 500 mL via INTRAVENOUS

## 2017-12-20 MED ORDER — HEPARIN (PORCINE) IN NACL 100-0.45 UNIT/ML-% IJ SOLN
800.0000 [IU]/h | INTRAMUSCULAR | Status: DC
  Administered 2017-12-20: 800 [IU]/h via INTRAVENOUS
  Filled 2017-12-20: qty 250

## 2017-12-20 MED ORDER — TAMSULOSIN HCL 0.4 MG PO CAPS
0.4000 mg | ORAL_CAPSULE | Freq: Every day | ORAL | Status: DC
  Administered 2017-12-20 – 2017-12-22 (×3): 0.4 mg via ORAL
  Filled 2017-12-20 (×3): qty 1

## 2017-12-20 MED ORDER — ASPIRIN EC 81 MG PO TBEC
81.0000 mg | DELAYED_RELEASE_TABLET | Freq: Every day | ORAL | Status: DC
  Administered 2017-12-20 – 2017-12-22 (×3): 81 mg via ORAL
  Filled 2017-12-20 (×3): qty 1

## 2017-12-20 MED ORDER — MEMANTINE HCL 10 MG PO TABS
5.0000 mg | ORAL_TABLET | Freq: Two times a day (BID) | ORAL | Status: DC
  Administered 2017-12-20 – 2017-12-22 (×5): 5 mg via ORAL
  Filled 2017-12-20 (×6): qty 1

## 2017-12-20 NOTE — Progress Notes (Signed)
ANTICOAGULATION CONSULT NOTE  Pharmacy Consult for heparin Indication: atrial fibrillation  No Known Allergies  Patient Measurements: Height: 5\' 8"  (172.7 cm) Weight: 130 lb (59 kg) IBW/kg (Calculated) : 68.4 Heparin Dosing Weight: 59.1kg  Vital Signs: BP: 126/63 (02/07 2106) Pulse Rate: 63 (02/07 2106)  Labs: Recent Labs    12/20/17 0103 12/20/17 0755 12/20/17 0855 12/20/17 1430 12/20/17 2135  HGB 9.4*  --   --   --   --   HCT 29.0*  --   --   --   --   PLT 203  --   --   --   --   APTT  --   --  32  --   --   LABPROT  --   --  14.3  --   --   INR  --   --  1.12  --   --   HEPARINUNFRC  --   --   --   --  0.85*  CREATININE 3.21* 2.96*  --  3.16*  --   TROPONINI  --  0.54*  --  0.55*  --     Estimated Creatinine Clearance: 13.5 mL/min (A) (by C-G formula based on SCr of 3.16 mg/dL (H)).  Assessment: Patient comes [place] with chief complaint of syncope.  Patient is noted to have new onset A. fib.  He has history of CAD/CHF.  CHA2DS2-VASc Score 5, pharmacy consulted to dose heparin.   - baseline aPTT = 32sec, INR = 1.12  12/20/2017   PM: heparin level SUPRAtherapeutic at 0.85 1st dose of warfarin given earlier this PM   Goal of Therapy:  Heparin level 0.3-0.7 units/ml  INR 2 -3  Plan:  Reduce Heparin drip to 700 units/hr Heparin level [place] 8 hours Daily CBC and heparin level   Doreene Eland, PharmD, BCPS.   Pager: [phone] 12/20/2017 10:30 PM

## 2017-12-20 NOTE — Progress Notes (Addendum)
No further episodes of RVR therefore will change admission status from stepdown to telemetry.  Erin Hearing ANP

## 2017-12-20 NOTE — ED Notes (Signed)
Bed: WA06 Expected date:  Expected time:  Means of arrival:  Comments: EMS syncopal atrial fib

## 2017-12-20 NOTE — Consult Note (Signed)
Cardiology Consultation:   Patient ID: Fred Griffin; [id number]; July 17, 1927   Admit date: 12/20/2017 Date of Consult: 12/20/2017  Primary Care Provider: Rosine Abe, DO Primary Cardiologist: Dr. Milana Huntsman Primary Electrophysiologist:  None   Patient Profile:   Fred Griffin is an 84 y.o. male with PMH of dementia, chronic systolic heart failure with EF 40% (04/2017), CAD with DES/PCI to RCA 07/2014, HTN, CVA 04/2017, CKD stage IV, BPH, Hx of prostate cancer, who is being seen today for the evaluation of atrial fibrillation with RVR at the request of Dr. Bonner Puna.  History of Present Illness:   Mr. Griffin is an 84 y.o. male with PMH of dementia, chronic systolic heart failure with EF 40% (04/2017), CAD with DES/PCI to RCA 07/2014, HTN, CVA 04/2017, CKD stage IV, BPH, Hx of prostate cancer, who was brought Fred by EMS after syncopal episode at [facility].  Patient is a poor historian 2/2 dementia and no family present at bedside at time of the interview. [place] interpretor used to conduct interview. Patient does not know why he is [place] the hospital. He reports being [place]idan his state of health without complaints.   Information obtained from chart review and staff.  Patient was using the toilet last night when he became unresponsive. EMS was activated and patient was found to be [place] atrial fibrillation with RVR with rate 170s, also with orthostasis for which he was given a small NS bolus. Patient denies CP, SOB, lightheadedness, or dizziness surround the episode. Does endorse some DOE when walking more than 127ft, although unable to elaborate more.   On arrival to the ED, EKG revealed patient to be [place] sinus rhythm, however around 3:40am, patient converted back to Afib RVR with rate 171 on EKG. Patient received NS bolus and converted back to NSR. VS: afebrile, stable BP, satting well on RA. Labs notable for K 5.8>5.3, Cr 3.21>2.96 (baseline 2.8), Hgb 9.4 (baseline 10.8), Troponin 0.54. CXR without acute  findings. Patient admitted to medicine with cardiology consulting for further management.   Past Medical History:  Diagnosis Date  . Altered mental status, unspecified 05/07/2017  . CAD [place] native artery   . CKD (chronic kidney disease), stage III ([place])   . Dementia   . Dysuria 11/08/2016  . Edema 05/07/2017  . History of colonoscopy   . History of ischemic left PCA stroke 05/07/2017  . Hypertension   . Macrocytic anemia   . Prostate cancer [facility])    Followed by Dr [name] -  Urologist [facility]  . ST elevation myocardial infarction (STEMI) involving right coronary artery [place] recovery phase ([place]) 07/2014   DES to the RCA, 90% OM1 treated medically, EF 50% by echo  . Syncope 01/2010    Past Surgical History:  Procedure Laterality Date  . CATARACT EXTRACTION     x 2   . CORONARY ANGIOPLASTY WITH STENT PLACEMENT  08/06/2014   LAD 50%, OM1  90/80%, RCA 99%, then 60%, treated with a 2.5 x 20 mm Promus stent to the RCA  . LEFT HEART CATHETERIZATION WITH CORONARY ANGIOGRAM N/A 08/06/2014   Procedure: LEFT HEART CATHETERIZATION WITH CORONARY ANGIOGRAM;  Surgeon: Kalin Kyler M [place], MD;  Location: [facility] CATH LAB;  Service: Cardiovascular;  Laterality: N/A;     Home Medications:  Prior to Admission medications   Medication Sig Start Date End Date Taking? Authorizing Provider  aspirin EC 81 MG EC tablet Take 1 tablet (81 mg total) by mouth daily. 08/10/14  Yes Barrett,  [name], PA-C  atorvastatin (LIPITOR) 40 MG tablet Take 40 mg by mouth daily.    Yes [provider]  carvedilol (COREG) 6.25 MG tablet Take 1 tablet (6.25 mg total) by mouth 2 (two) times daily with a meal. 08/10/14  Yes Barrett, [name], PA-C  ferrous sulfate (SM IRON) 325 (65 FE) MG tablet Take 325 mg by mouth daily with breakfast.    Yes [provider]  geriatric multivitamins-minerals (ELDERTONIC/GEVRABON) [name] Take 15 mLs by mouth daily.   Yes [provider]  hydrALAZINE (APRESOLINE) 10 MG tablet  Take 10 mg by mouth 3 (three) times daily.  05/10/17 12/20/17 Yes [provider]  memantine (NAMENDA) 5 MG tablet Take 5 mg by mouth 2 (two) times daily. for memory   Yes [provider]  mirtazapine (REMERON SOL-TAB) 15 MG disintegrating tablet Take 7.5 mg by mouth at bedtime.    Yes [provider]  ranitidine (ZANTAC) 75 MG tablet Take 75 mg by mouth at bedtime.    Yes [provider]  sertraline (ZOLOFT) 25 MG tablet Take 25 mg by mouth daily.    Yes [provider]  Skin Protectants, Misc. (EUCERIN) cream Apply 1 application topically at bedtime. to feet   Yes [provider]  sodium bicarbonate 650 MG tablet Take 650 mg by mouth daily.    Yes [provider]  tamsulosin (FLOMAX) 0.4 MG CAPS capsule Take 0.4 mg by mouth daily.  05/20/14  Yes [provider]    Inpatient Medications: Scheduled Meds: . aspirin  81 mg Oral Daily  . atorvastatin  40 mg Oral Daily  . carvedilol  6.25 mg Oral BID WC  . famotidine  20 mg Oral Daily  . ferrous sulfate  325 mg Oral Q breakfast  . geriatric multivitamins-minerals  15 mL Oral Daily  . memantine  5 mg Oral BID  . mirtazapine  7.5 mg Oral QHS  . sertraline  25 mg Oral Daily  . sodium bicarbonate  650 mg Oral Daily  . tamsulosin  0.4 mg Oral Daily   Continuous Infusions:  PRN Meds:   Allergies:   No Known Allergies  Social History:   Social History   Socioeconomic History  . Marital status: Married    Spouse name: Not on file  . Number of children: Not on file  . Years of education: Not on file  . Highest education level: Not on file  Social Needs  . Financial resource strain: Not on file  . Food insecurity - worry: Not on file  . Food insecurity - inability: Not on file  . Transportation needs - medical: Not on file  . Transportation needs - non-medical: Not on file  Occupational History  . Occupation: Retired    Fish farm manager: RETIRED  Tobacco Use  . Smoking  status: Former Smoker    Packs/day: 0.25    Types: Cigarettes  . Smokeless tobacco: Never Used  Substance and Sexual Activity  . Alcohol use: No  . Drug use: No  . Sexual activity: Not on file  Other Topics Concern  . Not on file  Social History Narrative   [place]    W[place], [place] with daughter)   2 sons ( 1 son [place], 1 son lives nearby)  2 daughters   Admitted to [facility]10/[facility]   Divorced   Full code    Family History:    Family History  Problem Relation Age of Onset  . Cancer Brother        Intestinal Cancer     ROS:  Please see the history of present illness.   All other ROS reviewed and negative.     Physical Exam/Data:   Vitals:   12/20/17 0346 12/20/17 0400 12/20/17 0611 12/20/17 0729  BP: 139/74 (!) 152/81 137/78 (!) 147/77  Pulse: 76  72 76  Resp: 19 13 20 15   Temp:    97.9 F (36.6 C)  TempSrc:    Oral  SpO2: 100% 100% 100% 99%    Intake/Output Summary (Last 24 hours) at 12/20/2017 5366 Last data filed at 12/20/2017 0051 Gross per 24 hour  Intake 400 ml  Output -  Net 400 ml   There were no vitals filed for this visit. There is no height or weight on file to calculate BMI.  General:  Elderly [place] gentleman, well nourished, well developed, laying Tayvon bed Liahm no acute distress HEENT: sclera anicteric  Neck: no JVD Vascular: No carotid bruits; distal pulses 2+ bilaterally Cardiac:  normal S1, S2; RRR; no murmur, gallops, or rubs Lungs:  clear to auscultation bilaterally, no wheezing, rhonchi or rales  Abd: NABS, soft, nontender, no hepatomegaly Ext: no edema Musculoskeletal:  No deformities, BUE and BLE strength normal and equal Skin: warm and dry  Neuro:  CNs 2-12 intact, no focal abnormalities noted Psych:  Pleasant, oriented to self.   EKG:  The EKG was personally reviewed and demonstrates: Initially with sinus rhythm, then repeat with Afib RVR with rate 171 Telemetry:  Telemetry was personally  reviewed and demonstrates: sinus rhythm with paroxysmal atrial fibrillation with RVR  Relevant CV Studies: Echocardiogram 04/2017 Mild global LV hypokinesis. Ejection fraction is visually estimated at 40% Mild Dilation of the aortic root, measured 4.48 cm by this study Mild aortic regurgitation. Mild mitral regurgitation. Mild tricuspid regurgitation  Cardiac Cath: 08/06/2014 Coronary angiography:  Coronary dominance: right  Left mainstem: Normal  Left anterior descending (LAD): The LAD is tortuous and moderately calcified. There is 30% proximal LAD disease. The mid LAD is diffusely diseased up to 50%.  Left circumflex (LCx): The LCx gives off a single OM branch then terminates Delos the AV groove. The first OM has a 90% proximal stenosis followed by diffuse 70-80% disease Shivaan the mid vessel.  Right coronary artery (RCA): The RCA is a dominant vessel. There is heavy calcification throughout. The proximal vessel has 30% stenosis. The distal vessel after the crux has a 99% stenosis. There is a long 50-60% stenosis Cleland the RCA farther distal. The PDA and PLOM are without significant disease.   Final Conclusions:  1. Severe 2 vessel obstructive CAD with culprit lesion Dion the distal RCA.  2. Successful stenting of the distal RCA with a DES.  Recommendations: I would continue DAPT for one year. I would treat his residual disease medically. If he has refractory angina despite optimal medical therapy PCI of the OM could be considered but would be complex. At his age hopefully medical therapy will be enough. If Hayes the future he needs cardiac cath the femoral approach should be used. If PCI of the RCA is needed Savir the future I would use a more supportive guide +/- Guideliner.   Laboratory Data:  Chemistry Recent Labs  Lab 12/20/17 0103  NA 139  K 5.8*  CL 112*  CO2 21*  GLUCOSE 115*  BUN 33*  CREATININE 3.21*  CALCIUM 8.0*  GFRNONAA 16*  GFRAA  18*  ANIONGAP 6    Recent Labs  Lab  12/20/17 0103  PROT 6.6  ALBUMIN 3.1*  AST 26  ALT 12*  ALKPHOS 64  BILITOT 0.6   Hematology Recent Labs  Lab 12/20/17 0103  WBC 5.9  RBC 2.98*  2.94*  HGB 9.4*  HCT 29.0*  MCV 97.3  MCH 31.5  MCHC 32.4  RDW 14.4  PLT 203   Cardiac EnzymesNo results for input(s): TROPONINI Santino the last 168 hours. No results for input(s): TROPIPOC Gotti the last 168 hours.  BNPNo results for input(s): BNP, PROBNP Jory the last 168 hours.  DDimer No results for input(s): DDIMER Mads the last 168 hours.  Radiology/Studies:  Dg Chest Portable 1 View  Result Date: 12/20/2017 CLINICAL DATA:  Syncope EXAM: PORTABLE CHEST 1 VIEW COMPARISON:  07/08/2017, 02/15/2015 FINDINGS: Mild enlargement of the cardiomediastinal silhouette. Hyperinflation. Biapical pleural and parenchymal scarring with mild hilar retraction. Linear atelectasis Xaviar the right upper lung. No acute consolidation or effusion. No pneumothorax. Tortuous ectatic aorta. IMPRESSION: 1. Mild cardiomegaly without acute pulmonary infiltrate or edema 2. Stable appearance of biapical pleural and parenchymal scarring. Electronically Signed   By: Donavan Foil M.D.   On: 12/20/2017 02:58    Assessment and Plan:   1. New onset paroxysmal atrial fibrillation with RVR: Patient with recent hospitalization 04/2017, found to have CVA. No evidence of atrial fibrillation observed throughout hospital stay and patient was not discharged with a loop recorder. Suspect he has had paroxysmal afib for some time which has not been captured.  - Converted to NSR after NS bolus - Continue home coreg - can consider uptitrating if frequency of RVR increases - This patients CHA2DS2-VASc Score and unadjusted Ischemic Stroke Rate (% per year) is equal to 11.2 % stroke rate/year from a score of 7 Above score calculated as 1 point each if present [CHF, HTN, DM, Vascular=MI/PAD/Aortic Plaque, Age if 65-74, or Male] Above score calculated as 2 points each if present [Age > 75, or  Stroke/TIA/TE] - Patient would benefit from anticoagulation for stroke prevention. He does have a history of falls. Anticipate that benefits outweigh risks at this time, however if patient exhibits recurrent falls or experiences a head strike, may need to consider discontinuing.  - Agree with coumadin per pharmacy. CKD limits use of DOAC - Echo pending  2. Mild troponin elevation: Troponin 0.54. Likely demand ischemia Brayten setting of Afib RVR. Patient without anginal complaints, although unreliable history given dementia.  - Continue to trend to peak  3. CAD with history of MI s/p DES/PCI to RCA: patient without anginal complaints - EKG on presentation with sinus rhythm, TWI V5-6 (old), and submm STE V2 - Continue home ASA, statin, coreg  4. Chronic systolic CHF: appears euvolemic on exam. Last Echo 04/2017 with EF 40% - Echo pending - Unable to utilize ACEi/ARB given CKD - Continue coreg  5. CKD stage IV: baseline Cr 2.8. - Cr 2.9 today - Continue to monitor closely    For questions or updates, please contact [place] Please consult [email or web address] for contact info under Cardiology/STEMI.   Signed, Abigail Butts, PA-C  12/20/2017 8:33 AM [phone]  Patient examined chart reviewed Limited history do to language barrier. Discussed care with PA and primary service Exam with frail oriental male Clear lungs no murmur no edema. He has CKD and cannot use NOAC CHA2VASC 7 With previous stroke favor heparin and starting coumadin Echo is pending to assess EF No long pause when he  converted not clear if this was etiology of syncope. IF EF normal and maintains NSR can see EP as outpatient to cosider ILR   Jenkins Rouge

## 2017-12-20 NOTE — ED Triage Notes (Signed)
Patient arrives by [facility] from [place] with complaints of syncopal episode. Per [place] staff-patient was on the toliet using the bathroom/staff was with patient-patient did not respond to them-they picked up patient and put him Kareem the bed and called EMS. Patient orthostatic with EMS-given NS 350 cc fluid bolus-MP atrial fib-rate Carley 170's with standing for EMS.

## 2017-12-20 NOTE — ED Notes (Addendum)
ECHO at bedside.

## 2017-12-20 NOTE — ED Notes (Signed)
Patient given glass of orange juice. Will check back and try to obtain urine again.

## 2017-12-20 NOTE — Progress Notes (Addendum)
ANTICOAGULATION CONSULT NOTE - Initial Consult  Pharmacy Consult for heparin Indication: atrial fibrillation  No Known Allergies  Patient Measurements:   Heparin Dosing Weight: 59.1kg  Vital Signs: Temp: 97.9 F (36.6 C) (02/07 0729) Temp Source: Oral (02/07 0729) BP: 147/77 (02/07 0729) Pulse Rate: 76 (02/07 0729)  Labs: Recent Labs    12/20/17 0103 12/20/17 0755  HGB 9.4*  --   HCT 29.0*  --   PLT 203  --   CREATININE 3.21* 2.96*  TROPONINI  --  0.54*    CrCl cannot be calculated (Unknown ideal weight.).   Medical History: Past Medical History:  Diagnosis Date  . Altered mental status, unspecified 05/07/2017  . CAD Fred Griffin native artery   . CKD (chronic kidney disease), stage III (Fred Griffin)   . Dementia   . Dysuria 11/08/2016  . Edema 05/07/2017  . History of colonoscopy   . History of ischemic left PCA stroke 05/07/2017  . Hypertension   . Macrocytic anemia   . [facility])    Followed by Dr [name] -  Urologist Fred Griffin Fred Griffin  . ST elevation myocardial infarction (STEMI) involving right coronary artery Fred Griffin recovery phase ([place]) 07/2014   DES to the RCA, 90% OM1 treated medically, EF 50% by echo  . Syncope 01/2010    Assessment: Patient comes Fred Griffin with chief complaint of syncope.  Patient is noted to have new onset A. fib.  He has history of CAD/CHF.  CHA2DS2-VASc Score 5, pharmacy consulted to dose heparin.   12/20/2017 Scr 2.96 H/H low, Plts WNL aPTT and INR ordered  Goal of Therapy:  Heparin level 0.3-0.7 units/ml   Plan:  Heparin bolus of 3000 units x 1 then  Start Heparin drip at 800 units/hr Heparin level Kylie 8 hours Daily CBC and heparin level   Dolly Rias RPh 12/20/2017, 8:49 AM Pager [phone]  Pharmacy now consulted to start warfarin with heparin overlap.  Goal of therapy: INR 2-3  Assessment and Plan:  INR 1.12 APTT 14.3 Pt also on daily asa 81mg   -Give warfarin 4mg  po x 1  -Daily INR -Continue heparin as above  Dolly Rias  RPh 12/20/2017, 10:10 AM Pager [phone]

## 2017-12-20 NOTE — H&P (Signed)
History and Physical    Fred Griffin NFA:[id number] DOB: 1927-08-26 [name]: 12/20/2017  **Will admit patient based on the expectation that the patient will need hospitalization/ hospital care that crosses at least 2 midnights  PCP: Rosine Abe, DO   Attending physician: Bonner Puna  Patient coming from/Resides with: SNF/[facility]  Chief Complaint: ?  Syncopal episode, orthostasis, PAF with RVR  HPI: Fred Griffin is a 84 y.o. male with medical history significant for history of stroke 2017, known CAD/MI with DES to RCA 2015, chronic combined systolic and diastolic heart failure with EF 40% based on echocardiogram June 2018 at outside facility, dyslipidemia, BPH, stage III chronic kidney disease, chronic anemia and dementia.  Patient sent from nursing facility after having a possible syncopal episode.  Patient was apparently sitting on the toilet and did not respond when spoken to.  The staff apparently picked him up and put him to bed and called EMS.  Upon EMS arrival it was documented he was orthostatic and he was given 350 cc of fluid.  After placement on telemetry he was noted to be experiencing atrial fibrillation with ventricular rates Fred Griffin the 170s upon standing.  He was sent to the ER for further evaluation and treatment.  By the time he arrived to the ER he was back Fred Griffin sinus rhythm with rates Fred Griffin the 70s.  He had an additional episode of PAF with rates Fred Griffin the 170s while Fred Griffin the ER and currently is rate controlled Fred Griffin the 60-70 range but maintains atrial fibrillation as his underlying rhythm.  He has otherwise remained hemodynamically stable and actually hypertensive.  He has slight worsening of his baseline chronic kidney disease with a BUN of 33 and a creatinine of 3.21 as well as an elevated potassium of 5.8.  He also has slight worsening of his anemia with a hemoglobin of 9.4.  Fred Griffin has contacted cardiology who will see the patient [place] consultation.  ED Course:  Vital Signs: BP (!) 147/77 (BP  Location: Right Arm)   Pulse 76   Temp 97.9 F (36.6 C) (Oral)   Resp 15   Ht 5\' 8"  (1.727 m)   Wt 59 kg (130 lb)   SpO2 99%   BMI 19.77 kg/m  CXR: Mild cardiomegaly without acute pulmonary infiltrate or edema Lab data: Sodium 139, potassium 5.8, chloride 112, CO2 21, glucose 115, BUN 33, creatinine 3.21, calcium 8, anion gap 6, albumin 3.1, LFTs not elevated, white count 5900 torrential not obtained, hemoglobin 9.4, MCV 97.3, platelets 203,000, urinalysis unremarkable except for 50 of glucose, 100 protein, rare bacteria, WBCs 0-5 Medications and treatments: Normal saline bolus times 500 cc  Review of Systems:  **History obtained via the assistance of a [place].  History limited by patient's underlying dementia.  Despite confirming with nursing home staff and EMS staff that he was experiencing palpitations while on the toilet patient currently denying palpitations or chest pain.  He does confirm that while ambulating with rolling walker but he gets short of breath without any other associated symptoms after walking about 100 feet.   Past Medical History:  Diagnosis Date  . Altered mental status, unspecified 05/07/2017  . CAD Laird native artery   . CKD (chronic kidney disease), stage III ([employer])   . Dementia   . Dysuria 11/08/2016  . Edema 05/07/2017  . History of colonoscopy   . History of ischemic left PCA stroke 05/07/2017  . Hypertension   . Macrocytic anemia   . Prostate cancer (  [place])    Followed by Dr [name] -  Urologist [facility]  . ST elevation myocardial infarction (STEMI) involving right coronary artery [place] recovery phase ([place]) 07/2014   DES to the RCA, 90% OM1 treated medically, EF 50% by echo  . Syncope 01/2010    Past Surgical History:  Procedure Laterality Date  . CATARACT EXTRACTION     x 2   . CORONARY ANGIOPLASTY WITH STENT PLACEMENT  08/06/2014   LAD 50%, OM1  90/80%, RCA 99%, then 60%, treated with a 2.5 x 20 mm Promus stent to the RCA  . LEFT HEART  CATHETERIZATION WITH CORONARY ANGIOGRAM N/A 08/06/2014   Procedure: LEFT HEART CATHETERIZATION WITH CORONARY ANGIOGRAM;  Surgeon: [name], MD;  Location: [facility] CATH LAB;  Service: Cardiovascular;  Laterality: N/A;    Social History   Socioeconomic History  . Marital status: Married    Spouse name: Not on file  . Number of children: Not on file  . Years of education: Not on file  . Highest education level: Not on file  Social Needs  . Financial resource strain: Not on file  . Food insecurity - worry: Not on file  . Food insecurity - inability: Not on file  . Transportation needs - medical: Not on file  . Transportation needs - non-medical: Not on file  Occupational History  . Occupation: Retired    Fish farm manager: RETIRED  Tobacco Use  . Smoking status: Former Smoker    Packs/day: 0.25    Types: Cigarettes  . Smokeless tobacco: Never Used  Substance and Sexual Activity  . Alcohol use: No  . Drug use: No  . Sexual activity: Not on file  Other Topics Concern  . Not on file  Social History Narrative   [place]    Wife lives [place], [place] with daughter)   2 sons ( 1 son [place], 1 son lives nearby)  2 daughters   Ad[facility] to [facility] 05/10/17   Divorced   Full code    Mobility: Rolling walker Work history: Not obtained   No Known Allergies  Family History  Problem Relation Age of Onset  . Cancer Brother        Intestinal Cancer     Prior to Admission medications   Medication Sig Start Date End Date Taking? Authorizing Provider  aspirin EC 81 MG EC tablet Take 1 tablet (81 mg total) by mouth daily. 08/10/14  Yes Barrett, [name], PA-C  atorvastatin (LIPITOR) 40 MG tablet Take 40 mg by mouth daily.    Yes [provider]  carvedilol (COREG) 6.25 MG tablet Take 1 tablet (6.25 mg total) by mouth 2 (two) times daily with a meal. 08/10/14  Yes Barrett, [name], PA-C  ferrous sulfate (SM IRON) 325 (65 FE) MG tablet Take 325 mg by mouth  daily with breakfast.    Yes [provider]  geriatric multivitamins-minerals (ELDERTONIC/GEVRABON) [name] Take 15 mLs by mouth daily.   Yes [provider]  hydrALAZINE (APRESOLINE) 10 MG tablet Take 10 mg by mouth 3 (three) times daily.  05/10/17 12/20/17 Yes [provider]  memantine (NAMENDA) 5 MG tablet Take 5 mg by mouth 2 (two) times daily. for memory   Yes [provider]  mirtazapine (REMERON SOL-TAB) 15 MG disintegrating tablet Take 7.5 mg by mouth at bedtime.    Yes [provider]  ranitidine (ZANTAC) 75 MG tablet Take 75 mg by mouth at  bedtime.    Yes [provider]  sertraline (ZOLOFT) 25 MG tablet Take 25 mg by mouth daily.    Yes [provider]  Skin Protectants, Misc. (EUCERIN) cream Apply 1 application topically at bedtime. to feet   Yes [provider]  sodium bicarbonate 650 MG tablet Take 650 mg by mouth daily.    Yes [provider]  tamsulosin (FLOMAX) 0.4 MG CAPS capsule Take 0.4 mg by mouth daily.  05/20/14  Yes [provider]    Physical Exam: Vitals:   12/20/17 0400 12/20/17 0611 12/20/17 0729 12/20/17 0842  BP: (!) 152/81 137/78 (!) 147/77   Pulse:  72 76   Resp: 13 20 15    Temp:   97.9 F (36.6 C)   TempSrc:   Oral   SpO2: 100% 100% 99%   Weight:    59 kg (130 lb)  Height:    5\' 8"  (1.727 m)      Constitutional: NAD, calm, comfortable Eyes: PERRL, lids and conjunctivae normal ENMT: Mucous membranes are moist. Posterior pharynx clear of any exudate or lesions.Normal dentition.  Neck: normal, supple, no masses, no thyromegaly Respiratory: clear to auscultation bilaterally, no wheezing, no crackles. Normal respiratory effort. No accessory muscle use.  Cardiovascular: Irregular rhythm/atrial fibrillation-ventricular response 60-70 bpm, no murmurs / rubs / gallops. No extremity edema. 2+ pedal pulses. No carotid bruits.  Abdomen: no tenderness, no masses palpated. No  hepatosplenomegaly. Bowel sounds positive.  Musculoskeletal: no clubbing / cyanosis. No joint deformity upper and lower extremities. Good ROM, no contractures. Normal muscle tone.  Skin: no rashes, lesions, ulcers. No induration Neurologic: CN 2-12 grossly intact. Sensation intact, DTR normal. Strength 5/5 x all 4 extremities.  Psychiatric: Alert and oriented x name. Normal mood.  Assessment limited by patient's underlying dementia and need for translating services.   Labs on Admission: I have personally reviewed following labs and imaging studies  CBC: Recent Labs  Lab 12/20/17 0103  WBC 5.9  HGB 9.4*  HCT 29.0*  MCV 97.3  PLT 161   Basic Metabolic Panel: Recent Labs  Lab 12/20/17 0103 12/20/17 0755  NA 139 140  K 5.8* 5.3*  CL 112* 116*  CO2 21* 19*  GLUCOSE 115* 105*  BUN 33* 30*  CREATININE 3.21* 2.96*  CALCIUM 8.0* 7.3*   GFR: Estimated Creatinine Clearance: 14.4 mL/min (A) (by C-G formula based on SCr of 2.96 mg/dL (H)). Liver Function Tests: Recent Labs  Lab 12/20/17 0103  AST 26  ALT 12*  ALKPHOS 64  BILITOT 0.6  PROT 6.6  ALBUMIN 3.1*   No results for input(s): LIPASE, AMYLASE Lief the last 168 hours. No results for input(s): AMMONIA Joson the last 168 hours. Coagulation Profile: Recent Labs  Lab 12/20/17 0855  INR 1.12   Cardiac Enzymes: Recent Labs  Lab 12/20/17 0755  TROPONINI 0.54*   BNP (last 3 results) No results for input(s): PROBNP Tameem the last 8760 hours. HbA1C: No results for input(s): HGBA1C Finnley the last 72 hours. CBG: Recent Labs  Lab 12/20/17 0056  GLUCAP 93   Lipid Profile: No results for input(s): CHOL, HDL, LDLCALC, TRIG, CHOLHDL, LDLDIRECT Marqus the last 72 hours. Thyroid Function Tests: No results for input(s): TSH, T4TOTAL, FREET4, T3FREE, THYROIDAB Jesua the last 72 hours. Anemia Panel: Recent Labs    12/20/17 0103  RETICCTPCT 1.2   Urine analysis:    Component Value Date/Time   COLORURINE YELLOW 12/20/2017 0512    APPEARANCEUR CLEAR 12/20/2017 0512   LABSPEC  1.014 12/20/2017 0512   PHURINE 6.0 12/20/2017 0512   GLUCOSEU 50 (A) 12/20/2017 0512   HGBUR NEGATIVE 12/20/2017 0512   BILIRUBINUR NEGATIVE 12/20/2017 0512   KETONESUR NEGATIVE 12/20/2017 0512   PROTEINUR 100 (A) 12/20/2017 0512   UROBILINOGEN 0.2 12/14/2014 1315   NITRITE NEGATIVE 12/20/2017 0512   LEUKOCYTESUR NEGATIVE 12/20/2017 0512   Sepsis Labs: @LABRCNTIP (procalcitonin:4,lacticidven:4) )No results found for this or any previous visit (from the past 240 hour(s)).   Radiological Exams on Admission: Dg Chest Portable 1 View  Result Date: 12/20/2017 CLINICAL DATA:  Syncope EXAM: PORTABLE CHEST 1 VIEW COMPARISON:  07/08/2017, 02/15/2015 FINDINGS: Mild enlargement of the cardiomediastinal silhouette. Hyperinflation. Biapical pleural and parenchymal scarring with mild hilar retraction. Linear atelectasis Rube the right upper lung. No acute consolidation or effusion. No pneumothorax. Tortuous ectatic aorta. IMPRESSION: 1. Mild cardiomegaly without acute pulmonary infiltrate or edema 2. Stable appearance of biapical pleural and parenchymal scarring. Electronically Signed   By: Donavan Foil M.D.   On: 12/20/2017 02:58    EKG: (Independently reviewed) atrial fibrillation with ventricular rate 171 beats per minute with QTC 548 ms Hassani context of rapid ventricular rate, nonspecific ST changes V5 and V6 unchanged from previous EKG 2016  Assessment/Plan Principal Problem:   New onset atrial fibrillation  -Patient presents with syncope vs presyncopal episode while at nursing facility, found to be orthostatic Minard context of new onset atrial fibrillation with rapid ventricular response Jonahtan the 170s-symptoms improved with fluid challenge although pt remains Amaro atrial fibrillation with ventricular rates Tauno the 60-70 range -CHA2DS2-VASc=7 with an unadjusted stroke rate of 11.2 % per year if not placed on anticoagulation -Unable to utilize NOAC Avonte context of  severe CKD -Cardiology has been consulted-recommendation is for continued utilization of AVN blocking agent (carvedilol) and to begin anticoagulation with warfarin and IV heparin overlap -Continue telemetry monitoring -Echocardiogram -TSH -PT/OT evaluation regarding fall risk  Active Problems:   CKD (chronic kidney disease) stage 4, GFR 15-29 ml/min  -Followed by Dr. Jesse Sans Silas the outpatient setting -Chronic kidney disease has progressed from stage III to stage IV based on previous readings -Continue preadmission oral sodium bicarb for chronic metabolic acidemia -Follow labs -Judicious use of IV fluids/boluses -Renal function August 2018: BUN 30 and creatinine 2.55 -Renal function at presentation: BUN 33 and creatinine 3.21 -Renal function after total volume 850 cc normal saline: BUN 30 and creatinine 2.96    Acute hyperkalemia -Otoniel context of mild volume depletion, progressive kidney disease, and low perfusion Shah context of A. fib with rapid ventricular response -K+ has decreased from 5.8 to 5.3 after boluses -We will give a dose of Kayexalate  -1 amp of calcium gluconate for cardioprotective effects -1 amp D50 with 10 units IV insulin to assist Sahith shifting K+ -Repeat electrolytes later this afternoon    CAD (coronary artery disease)-h/o MI/DES RCA 2015 -Followed as an outpatient with Dr. Clydie Braun [facility] -Last OV January 2019-she denied cardiovascular complaints and exam unremarkable-recommendation to follow-up Santino 6 months -For completeness of evaluation echocardiogram as above -Cycle troponin -Continue carvedilol, and statin -Holding low-dose aspirin with initiation of anticoagulation    Chronic combined systolic and diastolic heart failure  -Currently appears compensated and euvolemic -Reviewed Care Everywhere: Echocardiogram June 2018: EF 40% with global LV hypokinesis, mild AR, MR and TR -According to outpatient documentation from cardiology [place] 2015  ARB/ACE I unable to be utilized secondary to severity of CKD and issues with hyperkalemia -Not on diuretics prior to admission -Continue  carvedilol as above -Daily weights, strict I's/O -Echocardiogram as above    Macrocytic anemia -Continue iron replacement -Anemia panel    Dementia -Appears at baseline -Continue preadmission Zoloft, Namenda and Remeron     History of CVA (cerebrovascular accident)  -No appreciable focal neurological deficits -Preadmission baby aspirin on hold secondary to initiation of anticoagulation -Continue statin     BPH/history of prostate cancer -Continue Flomax      DVT prophylaxis: Warfarin with IV heparin overlap Code Status: Full Family Communication: No family at bedside Disposition Plan: SNF Consults called: Cardiology/Nishan    Lynessa Almanzar L. ANP-BC [facility] Pager [phone]   If 7PM-7AM, please contact night-coverage [email or web address] Password [email or web address]  12/20/2017, 9:52 AM

## 2017-12-20 NOTE — ED Provider Notes (Signed)
[facility] DEPT Provider Note   CSN: [id number] Arrival date & time: 12/20/17  0040     History   Chief Complaint Chief Complaint  Patient presents with  . Syncope  . Hypotension  . Atrial Fibrillation    HPI Fred Griffin is a 84 y.o. male.  Patient with a history of CKD, CAD, HTN, HLD presents from [facility] after being found syncopal. He was Fred Griffin the bathroom and found by staff unconscious. Per EMS, the patient was found to be Fred Griffin A-fib with RVR, rate Fred Griffin the 170's. The patient denies history of same. Here he is NSR, normal rate. He denies chest pain or SOB. No nausea or vomiting. No recent illness. The patient is not cognizant of why he is here nor can he contribute to history. He had a MI Fred Griffin 2015, per chart review, however, patient denies having any heart problems or having a heart doctor. He states he is here because he cannot sleep.   The history is provided by the patient and the EMS personnel. No language interpreter was used.  Atrial Fibrillation     Past Medical History:  Diagnosis Date  . Altered mental status, unspecified 05/07/2017  . CAD [name] native artery   . CKD (chronic kidney disease), stage III ([place])   . Dementia   . Dysuria 11/08/2016  . Edema 05/07/2017  . History of colonoscopy   . History of ischemic left PCA stroke 05/07/2017  . Hypertension   . Macrocytic anemia   . Prostate cancer [name])    Followed by Dr [name] -  Urologist [name]  . ST elevation myocardial infarction (STEMI) involving right coronary artery [place] recovery phase ([place]) 07/2014   DES to the RCA, 90% OM1 treated medically, EF 50% by echo  . Syncope 01/2010    Patient Active Problem List   Diagnosis Date Noted  . Acute ischemic left posterior cerebral artery (PCA) stroke ([place]) 05/12/2017  . HLD (hyperlipidemia) 05/12/2017  . Dementia with behavioral disturbance 05/12/2017  . GERD (gastroesophageal reflux disease) 05/12/2017  . Iron deficiency anemia  05/12/2017  . CAD [name] native artery   . Hypertension   . Hyperkalemia 08/08/2014  . ST elevation myocardial infarction (STEMI) involving right coronary artery [place] recovery phase ([place]) 08/06/2014  . Renal cysts, acquired, bilateral 02/01/2011  . Chronic renal insufficiency, stage IV (severe) ([place]) 11/02/2010  . HYPERGLYCEMIA 11/02/2010  . Essential hypertension 04/07/2010  . BPH (benign prostatic hyperplasia) 04/07/2010  . PROSTATE CANCER, HX OF 04/07/2010    Past Surgical History:  Procedure Laterality Date  . CATARACT EXTRACTION     x 2   . CORONARY ANGIOPLASTY WITH STENT PLACEMENT  08/06/2014   LAD 50%, OM1  90/80%, RCA 99%, then 60%, treated with a 2.5 x 20 mm Promus stent to the RCA  . LEFT HEART CATHETERIZATION WITH CORONARY ANGIOGRAM N/A 08/06/2014   Procedure: LEFT HEART CATHETERIZATION WITH CORONARY ANGIOGRAM;  Surgeon: [name], MD;  Location: [facility] CATH LAB;  Service: Cardiovascular;  Laterality: N/A;       Home Medications    Prior to Admission medications   Medication Sig Start Date End Date Taking? Authorizing Provider  aspirin EC 81 MG EC tablet Take 1 tablet (81 mg total) by mouth daily. 08/10/14  Yes Barrett, [name], PA-C  atorvastatin (LIPITOR) 40 MG tablet Take 40 mg by mouth daily.    Yes [provider]  carvedilol (COREG) 6.25 MG tablet Take 1 tablet (  6.25 mg total) by mouth 2 (two) times daily with a meal. 08/10/14  Yes Barrett, [name], PA-C  ferrous sulfate (SM IRON) 325 (65 FE) MG tablet Take 325 mg by mouth daily with breakfast.    Yes [provider]  geriatric multivitamins-minerals (ELDERTONIC/GEVRABON) [name] Take 15 mLs by mouth daily.   Yes [provider]  hydrALAZINE (APRESOLINE) 10 MG tablet Take 10 mg by mouth 3 (three) times daily.  05/10/17 12/20/17 Yes [provider]  memantine (NAMENDA) 5 MG tablet Take 5 mg by mouth 2 (two) times daily. for memory   Yes [provider]  mirtazapine (REMERON  SOL-TAB) 15 MG disintegrating tablet Take 7.5 mg by mouth at bedtime.    Yes [provider]  ranitidine (ZANTAC) 75 MG tablet Take 75 mg by mouth at bedtime.    Yes [provider]  sertraline (ZOLOFT) 25 MG tablet Take 25 mg by mouth daily.    Yes [provider]  Skin Protectants, Misc. (EUCERIN) cream Apply 1 application topically at bedtime. to feet   Yes [provider]  sodium bicarbonate 650 MG tablet Take 650 mg by mouth daily.    Yes [provider]  tamsulosin (FLOMAX) 0.4 MG CAPS capsule Take 0.4 mg by mouth daily.  05/20/14  Yes [provider]    Family History Family History  Problem Relation Age of Onset  . Cancer Brother        Intestinal Cancer    Social History Social History   Tobacco Use  . Smoking status: Former Smoker    Packs/day: 0.25    Types: Cigarettes  . Smokeless tobacco: Never Used  Substance Use Topics  . Alcohol use: No  . Drug use: No     Allergies   Patient has no known allergies.   Review of Systems Review of Systems  Unable to perform ROS: Dementia     Physical Exam Updated Vital Signs BP (!) 152/81   Pulse 76   Temp 98 F (36.7 C) (Oral)   Resp 13   SpO2 100%   Physical Exam  Constitutional: He appears well-developed and well-nourished.  HENT:  Head: Normocephalic.  Neck: Normal range of motion. Neck supple.  Cardiovascular: Normal rate and regular rhythm.  Pulmonary/Chest: Effort normal and breath sounds normal. He has no wheezes. He has no rales.  Abdominal: Soft. Bowel sounds are normal. There is no tenderness. There is no rebound and no guarding.  Musculoskeletal: Normal range of motion. He exhibits no edema.  Neurological: He is alert. Coordination normal.  Skin: Skin is warm and dry. No rash noted.  Psychiatric: He has a normal mood and affect.     ED Treatments / Results  Labs (all labs ordered are listed, but only abnormal results are displayed) Labs  Reviewed  CBC - Abnormal; Notable for the following components:      Result Value   RBC 2.98 (*)    Hemoglobin 9.4 (*)    HCT 29.0 (*)    All other components within normal limits  COMPREHENSIVE METABOLIC PANEL - Abnormal; Notable for the following components:   Potassium 5.8 (*)    Chloride 112 (*)    CO2 21 (*)    Glucose, Bld 115 (*)    BUN 33 (*)    Creatinine, Ser 3.21 (*)    Calcium 8.0 (*)    Albumin 3.1 (*)    ALT 12 (*)    GFR calc non Af Amer 16 (*)  GFR calc Af Amer 18 (*)    All other components within normal limits  URINALYSIS, ROUTINE W REFLEX MICROSCOPIC  CBG MONITORING, ED    EKG  EKG Interpretation  Date/Time:  Thursday December 20 2017 01:07:10 EST Ventricular Rate:  74 PR Interval:    QRS Duration: 92 QT Interval:  443 QTC Calculation: 492 R Axis:   -72 Text Interpretation:  Sinus rhythm Left anterior fascicular block Abnormal R-wave progression, early transition Abnormal T, consider ischemia, lateral leads Minimal ST elevation, anterior leads No acute changes Confirmed by Varney Biles ([id number]) on 12/20/2017 2:24:38 AM       Radiology Dg Chest Portable 1 View  Result Date: 12/20/2017 CLINICAL DATA:  Syncope EXAM: PORTABLE CHEST 1 VIEW COMPARISON:  07/08/2017, 02/15/2015 FINDINGS: Mild enlargement of the cardiomediastinal silhouette. Hyperinflation. Biapical pleural and parenchymal scarring with mild hilar retraction. Linear atelectasis Laydon the right upper lung. No acute consolidation or effusion. No pneumothorax. Tortuous ectatic aorta. IMPRESSION: 1. Mild cardiomegaly without acute pulmonary infiltrate or edema 2. Stable appearance of biapical pleural and parenchymal scarring. Electronically Signed   By: Donavan Foil M.D.   On: 12/20/2017 02:58    Procedures Procedures (including critical care time)  Medications Ordered [place] ED Medications  sodium chloride 0.9 % bolus 500 mL (500 mLs Intravenous New Bag/Given 12/20/17 0300)     Initial Impression  / Assessment and Plan / ED Course  I have reviewed the triage vital signs and the nursing notes.  Pertinent labs & imaging results that were available during my care of the patient were reviewed by me and considered Kjell my medical decision making (see chart for details).     Patient arrives for evaluation of syncopal episode. Per EMS, found Bereket a-fib with RVR to 170's and orthostatic on their exam. Here the patient's HR is 74. EKG shows normal sinus rhythm.  During the course of evaluation, he is again found Flavio a-fib with RVR to 170's. The patient states he can feel his heart racing but denies pain or SOB. He again spontaneously converts to NSR, normal rate.   Labs c/w prior history of CKD. HGB same as Christine the past. He appears comfortable.   Patient will need admission for further evaluation of syncope, likely related to new onset paroxysmal atrial fibrillation with runs of a-fib [place] RVR to 170's.   Cardiology paged. Dr. Johnsie Cancel advises patient can be admitted to [facility] by medicine and they will consult  DR. Alcario Drought, [facility], admits to morning team.   Final Clinical Impressions(s) / ED Diagnoses   Final diagnoses:  None   1. Paroxysmal atrial fibrillation with RVR 2. Syncope   ED Discharge Orders    None       Charlann Lange, PA-C 12/20/17 [id number]    Varney Biles, MD 12/20/17 [phone]

## 2017-12-20 NOTE — Progress Notes (Signed)
  Echocardiogram 2D Echocardiogram has been performed.  Nehemiah Mcfarren T Dayquan Buys 12/20/2017, 4:39 PM

## 2017-12-21 DIAGNOSIS — Z8673 Personal history of transient ischemic attack (TIA), and cerebral infarction without residual deficits: Secondary | ICD-10-CM

## 2017-12-21 DIAGNOSIS — I4891 Unspecified atrial fibrillation: Secondary | ICD-10-CM

## 2017-12-21 DIAGNOSIS — E875 Hyperkalemia: Secondary | ICD-10-CM

## 2017-12-21 DIAGNOSIS — F0390 Unspecified dementia without behavioral disturbance: Secondary | ICD-10-CM

## 2017-12-21 DIAGNOSIS — I2510 Atherosclerotic heart disease of native coronary artery without angina pectoris: Secondary | ICD-10-CM

## 2017-12-21 DIAGNOSIS — N184 Chronic kidney disease, stage 4 (severe): Secondary | ICD-10-CM

## 2017-12-21 LAB — LIPID PANEL
CHOLESTEROL: 133 mg/dL (ref 0–200)
HDL: 54 mg/dL (ref >40)
LDL CALC: 67 mg/dL (ref 0–99)
Total CHOL/HDL Ratio: 2.5 RATIO
Triglycerides: 59 mg/dL (ref <150)
VLDL: 12 mg/dL (ref 0–40)

## 2017-12-21 LAB — CBC
HCT: 28.4 % — ABNORMAL LOW (ref 39.0–52.0)
HEMOGLOBIN: 9.5 g/dL — AB (ref 13.0–17.0)
MCH: 31.9 pg (ref 26.0–34.0)
MCHC: 33.5 g/dL (ref 30.0–36.0)
MCV: 95.3 fL (ref 78.0–100.0)
Platelets: 239 10*3/uL (ref 150–400)
RBC: 2.98 MIL/uL — ABNORMAL LOW (ref 4.22–5.81)
RDW: 14.4 % (ref 11.5–15.5)
WBC: 6.5 10*3/uL (ref 4.0–10.5)

## 2017-12-21 LAB — PROTIME-INR
INR: 1.12
PROTHROMBIN TIME: 14.3 s (ref 11.4–15.2)

## 2017-12-21 LAB — BASIC METABOLIC PANEL
Anion gap: 8 (ref 5–15)
BUN: 34 mg/dL — AB (ref 6–20)
CHLORIDE: 114 mmol/L — AB (ref 101–111)
CO2: 20 mmol/L — AB (ref 22–32)
CREATININE: 3.22 mg/dL — AB (ref 0.61–1.24)
Calcium: 8.4 mg/dL — ABNORMAL LOW (ref 8.9–10.3)
GFR calc Af Amer: 18 mL/min — ABNORMAL LOW (ref >60)
GFR calc non Af Amer: 16 mL/min — ABNORMAL LOW (ref >60)
Glucose, Bld: 121 mg/dL — ABNORMAL HIGH (ref 65–99)
Potassium: 4.7 mmol/L (ref 3.5–5.1)
SODIUM: 142 mmol/L (ref 135–145)

## 2017-12-21 LAB — HEPARIN LEVEL (UNFRACTIONATED)
Heparin Unfractionated: 0.62 IU/mL (ref 0.30–0.70)
Heparin Unfractionated: 0.57 IU/mL (ref 0.30–0.70)

## 2017-12-21 LAB — MRSA PCR SCREENING: MRSA by PCR: NEGATIVE

## 2017-12-21 MED ORDER — WARFARIN SODIUM 4 MG PO TABS
4.0000 mg | ORAL_TABLET | Freq: Once | ORAL | Status: AC
  Administered 2017-12-21: 4 mg via ORAL
  Filled 2017-12-21: qty 1

## 2017-12-21 MED ORDER — HEPARIN (PORCINE) IN NACL 100-0.45 UNIT/ML-% IJ SOLN
700.0000 [IU]/h | INTRAMUSCULAR | Status: DC
  Administered 2017-12-22: 700 [IU]/h via INTRAVENOUS
  Filled 2017-12-21 (×2): qty 250

## 2017-12-21 MED ORDER — HALOPERIDOL LACTATE 5 MG/ML IJ SOLN: 2.0000 mg | Freq: Four times a day (QID) | INTRAMUSCULAR | Status: DC | PRN

## 2017-12-21 MED ORDER — "THROMBI-PAD 3""X3"" EX PADS"
1.0000 | MEDICATED_PAD | Freq: Once | CUTANEOUS | Status: AC
  Administered 2017-12-21: 1 via TOPICAL
  Filled 2017-12-21: qty 1

## 2017-12-21 NOTE — Progress Notes (Signed)
PROGRESS NOTE  Matheson [name] DOWLAND FXT:[id number] DOB: 03/22/1927 [name]: 12/20/2017 PCP: [name], [name], DO   LOS: 1 day   Brief Narrative / Interim history: 84 year old male with history of dementia, combined systolic and diastolic CHF, chronic kidney disease stage IV, coronary artery disease with prior N STEMI, who was admitted to the hospital with episode of unresponsiveness, and also was found to have A. fib with rapid ventricular response into the 170s.  He was given fluids and then spontaneously converted back to sinus rhythm.  He was started on anticoagulation and cardiology was consulted.  Assessment & Plan: Principal Problem:   New onset atrial fibrillation (HCC) Active Problems:   CKD (chronic kidney disease) stage 4, GFR 15-29 ml/min (HCC)   Acute hyperkalemia   CAD (coronary artery disease)-h/o MI/DES RCA 2015   Macrocytic anemia   Dementia   Chronic combined systolic and diastolic heart failure (HCC)   History of CVA (cerebrovascular accident)   New onset atrial fibrillation with RVR  -converted to sinus rhythm now, patient is on Coreg, continue -Started on heparin infusion for anticoagulation, will need Coumadin given chronic kidney disease -will bridge heparin/Coumadin, may be able to the Lovenox after discussing with pharmacy -Echocardiogram done on 2/7 showed normal EF (full report below) -TSH normal  Coronary artery disease -No chest pain, cardiology following  Chronic kidney disease stage IV -Creatinine appears close to baseline around 3, avoid nephrotoxic agents -Continue heparin/Coumadin  Chronic combined systolic and diastolic CHF -EF is within normal limits currently, 2D echo as below  Hyperkalemia -Likely [place] the setting of chronic kidney disease, potassium now improved  Mild dementia -Stable  Microcytic anemia -Likely [place] the setting of chronic kidney disease,   DVT prophylaxis: heparin Code Status: Full code Family Communication: no family at  bedside Disposition Plan: SNF 1-2 days  Consultants:   Cardiology   Procedures:   2D echo Impressions: - Apical hypokinesis with overall normal LV systolic function; moderate LVH; mild diastolic dysfunction; mild AI; mildly dilated aortic root (4.4 cm); mild RAE; mild TR with mildly elevated pulmonary pressure.  Antimicrobials:  None    Subjective: -Speaks very little English, tells me he was very hungry yesterday but happy today that he is eating some.  Denies any chest pain, nausea vomiting, no abdominal pain  Objective: Vitals:   12/21/17 0100 12/21/17 0102 12/21/17 0230 12/21/17 0623  BP: (!) 142/118 (!) 142/118 (!) 147/86 (!) 165/84  Pulse:  68 79 74  Resp: (!) 21 19 18 20   Temp:   97.7 F (36.5 C) 98 F (36.7 C)  TempSrc:   Oral Oral  SpO2: 99% 100% 94% 100%  Weight:   55.9 kg (123 lb 3.8 oz)   Height:   5\' 8"  (1.727 m)     Intake/Output Summary (Last 24 hours) at 12/21/2017 1219 Last data filed at 12/21/2017 2992 Gross per 24 hour  Intake 200.58 ml  Output 0 ml  Net 200.58 ml   Filed Weights   12/20/17 0842 12/21/17 0230  Weight: 59 kg (130 lb) 55.9 kg (123 lb 3.8 oz)    Examination:  Constitutional: NAD Eyes: lids and conjunctivae normal ENMT: Mucous membranes are moist. Neck: normal, supple Respiratory: clear to auscultation bilaterally, no wheezing, no crackles.  Cardiovascular: Regular rate and rhythm, no murmurs / rubs / gallops. No LE edema. Abdomen: no tenderness. Skin: no rashes Neurologic: non focal    Data Reviewed: I have independently reviewed following labs and imaging studies   CBC: Recent  Labs  Lab 12/20/17 0103 12/21/17 0429  WBC 5.9 6.5  HGB 9.4* 9.5*  HCT 29.0* 28.4*  MCV 97.3 95.3  PLT 203 518   Basic Metabolic Panel: Recent Labs  Lab 12/20/17 0103 12/20/17 0755 12/20/17 1430 12/21/17 0429  NA 139 140 141 142  K 5.8* 5.3* 3.9 4.7  CL 112* 116* 115* 114*  CO2 21* 19* 21* 20*  GLUCOSE 115* 105* 50* 121*  BUN 33*  30* 31* 34*  CREATININE 3.21* 2.96* 3.16* 3.22*  CALCIUM 8.0* 7.3* 8.4* 8.4*  MG  --  1.8  --   --    GFR: Estimated Creatinine Clearance: 12.5 mL/min (A) (by C-G formula based on SCr of 3.22 mg/dL (H)). Liver Function Tests: Recent Labs  Lab 12/20/17 0103  AST 26  ALT 12*  ALKPHOS 64  BILITOT 0.6  PROT 6.6  ALBUMIN 3.1*   No results for input(s): LIPASE, AMYLASE Joshuah the last 168 hours. No results for input(s): AMMONIA Jaedon the last 168 hours. Coagulation Profile: Recent Labs  Lab 12/20/17 0855 12/21/17 0429  INR 1.12 1.12   Cardiac Enzymes: Recent Labs  Lab 12/20/17 0755 12/20/17 1430 12/20/17 2135  TROPONINI 0.54* 0.55* 0.39*   BNP (last 3 results) No results for input(s): PROBNP Maxey the last 8760 hours. HbA1C: No results for input(s): HGBA1C Randie the last 72 hours. CBG: Recent Labs  Lab 12/20/17 0056  GLUCAP 93   Lipid Profile: Recent Labs    12/21/17 0429  CHOL 133  HDL 54  LDLCALC 67  TRIG 59  CHOLHDL 2.5   Thyroid Function Tests: Recent Labs    12/20/17 0755  TSH 0.732   Anemia Panel: Recent Labs    12/20/17 0103 12/20/17 0855  VITAMINB12  --  401  FOLATE  --  10.3  FERRITIN  --  247  TIBC  --  196*  IRON  --  50  RETICCTPCT 1.2  --    Urine analysis:    Component Value Date/Time   COLORURINE YELLOW 12/20/2017 0512   APPEARANCEUR CLEAR 12/20/2017 0512   LABSPEC 1.014 12/20/2017 0512   PHURINE 6.0 12/20/2017 0512   GLUCOSEU 50 (A) 12/20/2017 0512   HGBUR NEGATIVE 12/20/2017 0512   BILIRUBINUR NEGATIVE 12/20/2017 0512   KETONESUR NEGATIVE 12/20/2017 0512   PROTEINUR 100 (A) 12/20/2017 0512   UROBILINOGEN 0.2 12/14/2014 1315   NITRITE NEGATIVE 12/20/2017 0512   LEUKOCYTESUR NEGATIVE 12/20/2017 0512   Sepsis Labs: Invalid input(s): PROCALCITONIN, LACTICIDVEN  Recent Results (from the past 240 hour(s))  MRSA PCR Screening     Status: None   Collection Time: 12/21/17  2:53 AM  Result Value Ref Range Status   MRSA by PCR  NEGATIVE NEGATIVE Final    Comment:        The GeneXpert MRSA Assay (FDA approved for NASAL specimens only), is one component of a comprehensive MRSA colonization surveillance program. It is not intended to diagnose MRSA infection nor to guide or monitor treatment for MRSA infections. Performed at [facility], [place] [address], [place], [place] [address]       Radiology Studies: Dg Chest Portable 1 View  Result Date: 12/20/2017 CLINICAL DATA:  Syncope EXAM: PORTABLE CHEST 1 VIEW COMPARISON:  07/08/2017, 02/15/2015 FINDINGS: Mild enlargement of the cardiomediastinal silhouette. Hyperinflation. Biapical pleural and parenchymal scarring with mild hilar retraction. Linear atelectasis Gaige the right upper lung. No acute consolidation or effusion. No pneumothorax. Tortuous ectatic aorta. IMPRESSION: 1. Mild cardiomegaly without acute pulmonary infiltrate  or edema 2. Stable appearance of biapical pleural and parenchymal scarring. Electronically Signed   By: Donavan Foil M.D.   On: 12/20/2017 02:58     Scheduled Meds: . aspirin EC  81 mg Oral Daily  . atorvastatin  40 mg Oral Daily  . carvedilol  6.25 mg Oral BID WC  . famotidine  20 mg Oral Daily  . ferrous sulfate  325 mg Oral Q breakfast  . geriatric multivitamins-minerals  15 mL Oral Daily  . memantine  5 mg Oral BID  . mirtazapine  7.5 mg Oral QHS  . sertraline  25 mg Oral Daily  . sodium bicarbonate  650 mg Oral Daily  . tamsulosin  0.4 mg Oral Daily  . Warfarin - Pharmacist Dosing Inpatient   Does not apply q1800   Continuous Infusions: . heparin 700 Units/hr (12/21/17 0230)    Marzetta Board, MD, PhD [facility] Pager [phone]  If 7PM-7AM, please contact night-coverage [email or web address] Password [facility] 12/21/2017, 12:19 PM

## 2017-12-21 NOTE — Progress Notes (Signed)
ANTICOAGULATION CONSULT NOTE  Pharmacy Consult for heparin + warfarin Indication: atrial fibrillation  No Known Allergies  Patient Measurements: Height: 5\' 8"  (172.7 cm) Weight: 123 lb 3.8 oz (55.9 kg) IBW/kg (Calculated) : 68.4 Heparin Dosing Weight: 59.1kg  Vital Signs: Temp: 98 F (36.7 C) (02/08 0623) Temp Source: Oral (02/08 0623) BP: 165/84 (02/08 0623) Pulse Rate: 74 (02/08 0623)  Labs: Recent Labs    12/20/17 0103 12/20/17 0755 12/20/17 0855 12/20/17 1430 12/20/17 2135 12/21/17 0429 12/21/17 0749  HGB 9.4*  --   --   --   --  9.5*  --   HCT 29.0*  --   --   --   --  28.4*  --   PLT 203  --   --   --   --  239  --   APTT  --   --  32  --   --   --   --   LABPROT  --   --  14.3  --   --  14.3  --   INR  --   --  1.12  --   --  1.12  --   HEPARINUNFRC  --   --   --   --  0.85*  --  0.62  CREATININE 3.21* 2.96*  --  3.16*  --  3.22*  --   TROPONINI  --  0.54*  --  0.55* 0.39*  --   --     Estimated Creatinine Clearance: 12.5 mL/min (A) (by C-G formula based on SCr of 3.22 mg/dL (H)).  Assessment: Patient comes [place] with chief complaint of syncope.  Patient is noted to have new onset A. fib.  He has history of CAD/CHF.  CHA2DS2-VASc Score 5, pharmacy consulted to dose heparin and bridge to warfarin.   - baseline aPTT = 32sec, INR = 1.12  Today, 12/21/2017   Heparin level therapeutic at 0.62  INR subtherapeutic at 1.12 (1st dose of warfarin given yesterday afternoon)  CBC: Hgb low but stable, pltc WNL  No major drug interactions   Goal of Therapy:  Heparin level 0.3-0.7 units/ml  INR 2 -3  Plan:  Warfarin 4 mg PO x1 tonight at 1800 Continue Heparin drip at 700 units/hr Recheck confirmatory level at 8 hours Daily INR, CBC and heparin level   Rockwell Alexandria, PharmD Candidate 12/21/2017 11:29 AM

## 2017-12-21 NOTE — Progress Notes (Addendum)
ANTICOAGULATION CONSULT NOTE  Pharmacy Consult for heparin + warfarin Indication: atrial fibrillation  No Known Allergies  Patient Measurements: Height: 5\' 8"  (172.7 cm) Weight: 123 lb 3.8 oz (55.9 kg) IBW/kg (Calculated) : 68.4 Heparin Dosing Weight: 59.1kg  Vital Signs: Temp: 97.6 F (36.4 C) (02/08 1304) Temp Source: Oral (02/08 1304) BP: 111/78 (02/08 1304) Pulse Rate: 70 (02/08 1304)  Labs: Recent Labs    12/20/17 0103 12/20/17 0755 12/20/17 0855 12/20/17 1430 12/20/17 2135 12/21/17 0429 12/21/17 0749 12/21/17 1403  HGB 9.4*  --   --   --   --  9.5*  --   --   HCT 29.0*  --   --   --   --  28.4*  --   --   PLT 203  --   --   --   --  239  --   --   APTT  --   --  32  --   --   --   --   --   LABPROT  --   --  14.3  --   --  14.3  --   --   INR  --   --  1.12  --   --  1.12  --   --   HEPARINUNFRC  --   --   --   --  0.85*  --  0.62 0.57  CREATININE 3.21* 2.96*  --  3.16*  --  3.22*  --   --   TROPONINI  --  0.54*  --  0.55* 0.39*  --   --   --     Estimated Creatinine Clearance: 12.5 mL/min (A) (by C-G formula based on SCr of 3.22 mg/dL (H)).  Assessment: Patient comes Fred Griffin with chief complaint of syncope.  Patient is noted to have new onset A. fib.  He has history of CAD/CHF.  CHA2DS2-VASc Score 5, pharmacy consulted to dose heparin and bridge to warfarin.   - baseline aPTT = 32sec, INR = 1.12  Today, 12/21/2017   PM update - Confirmatory heparin level therapeutic at 0.57 on heparin at 700 units/hr  Reported that patient cut himself and was bleeding.  RN contacted MD and was told to stop heparin but it was stopped after the heparin level drawn  INR subtherapeutic at 1.12 (1st dose of warfarin given yesterday afternoon)  CBC: Hgb low but stable, pltc WNL  No major drug interactions   Goal of Therapy:  Heparin level 0.3-0.7 units/ml  INR 2 -3   Plan:  - heparin off per RN for bleeding after cut.  Discuss with Dr. Cruzita Lederer, plan to resume heparin at  6am.  Will resume at previous rate of 700 ml/hr Warfarin 4 mg PO x1 tonight at 1800 as per previous orders Daily INR, CBC and heparin level   Doreene Eland, PharmD, BCPS.   Pager: [phone] 12/21/2017 4:47 PM

## 2017-12-21 NOTE — Evaluation (Signed)
Physical Therapy Evaluation Patient Details Name: Fred Griffin MRN: [id number] DOB: 1927/03/10 Today's Date: 12/21/2017   History of Present Illness  This 84 y.o. maled admitted from [facility] due to decreased responsiveness thought to be syncope.  Pt found to be Fred Griffin A-FIb with RVR - HR 170s.   PMH includes:  CAD, NSTEMI, s/p PCI, stage IV CKD, dementia, h/o Lt PCA CVA 6/18,   Clinical Impression  Pt admitted with above diagnosis. Pt currently with functional limitations due to the deficits listed below (see PT Problem List).  Pt will benefit from skilled PT to increase their independence and safety with mobility to allow discharge to the venue listed below.  Pt communicated with visual signs and speaks minimal English.  Video interpreter Fred Griffin room however did not utilize has pt appeared to understand goal of mobility/ambulation.  (PLOF information gathered by OT earlier)  Pt did have bleeding at site of bruising on L forearm however RN called into room to assist end of session.      Follow Up Recommendations Home health PT;Supervision/Assistance - 24 hour    Equipment Recommendations  None recommended by PT    Recommendations for Other Services       Precautions / Restrictions Precautions Precautions: Fall      Mobility  Bed Mobility Overal bed mobility: Needs Assistance Bed Mobility: Sidelying to Sit   Sidelying to sit: Min guard Supine to sit: Min guard     General bed mobility comments: increased time and effort, pt sidelying on left upon entering room, upon sitting upright L arm with IV had bruising and observed new open spot of bleeding from this area so reinforced with dressing prior to mobility  Transfers Overall transfer level: Needs assistance Equipment used: Rolling walker (2 wheeled) Transfers: Sit to/from Stand Sit to Stand: Min guard Stand pivot transfers: Min guard       General transfer comment: pt indicates with pointing to use RW, utilizes his UEs to  self assist  Ambulation/Gait Ambulation/Gait assistance: Min guard Ambulation Distance (Feet): 200 Feet Assistive device: Rolling walker (2 wheeled) Gait Pattern/deviations: Step-through pattern;Decreased stride length;Trunk flexed     General Gait Details: visual cues for ambulating within RW, HR Fred Griffin 70s bpm during ambulation, pt with increased bleeding L arm so RN called into room to assist  Stairs            Wheelchair Mobility    Modified Rankin (Stroke Patients Only)       Balance Overall balance assessment: Needs assistance Sitting-balance support: Feet supported Sitting balance-Leahy Scale: Good     Standing balance support: No upper extremity supported Standing balance-Leahy Scale: Fair Standing balance comment: static standing with min guard assist without UE support                              Pertinent Vitals/Pain Pain Assessment: No/denies pain    Home Living Family/patient expects to be discharged to:: Assisted living               Home Equipment: Walker - 2 wheels Additional Comments: Pt resides at [name]     Prior Function Level of Independence: Independent with assistive device(s)         Comments: Per pt and sister, he was ambulatory with RW mod I level, and was able to perform ADLs mod I.   He indicates he has had 2 falls (all per OT  evaluation)     Hand Dominance        Extremity/Trunk Assessment   Upper Extremity Assessment Upper Extremity Assessment: Generalized weakness    Lower Extremity Assessment Lower Extremity Assessment: Generalized weakness    Cervical / Trunk Assessment Cervical / Trunk Assessment: Normal  Communication   Communication: Prefers language other than English(Korean)  Cognition Arousal/Alertness: Awake/alert Behavior During Therapy: WFL for tasks assessed/performed Overall Cognitive Status: History of cognitive impairments - at baseline                                  General Comments: sister indicates he is at baseline       General Comments General comments (skin integrity, edema, etc.): sister present.  Pt emphasizes that he wished to discharge home as soon as possible     Exercises     Assessment/Plan    PT Assessment Patient needs continued PT services  PT Problem List Decreased mobility;Decreased activity tolerance;Decreased knowledge of use of DME       PT Treatment Interventions DME instruction;Gait training;Therapeutic activities;Therapeutic exercise;Patient/family education;Functional mobility training;Balance training    PT Goals (Current goals can be found Fred Griffin the Care Plan section)  Acute Rehab PT Goals Patient Stated Goal: to go home ASAP  PT Goal Formulation: With patient Time For Goal Achievement: 01/04/18 Potential to Achieve Goals: Good    Frequency Min 3X/week   Barriers to discharge        Co-evaluation               AM-PAC PT "6 Clicks" Daily Activity  Outcome Measure Difficulty turning over Fred Griffin bed (including adjusting bedclothes, sheets and blankets)?: None Difficulty moving from lying on back to sitting on the side of the bed? : None Difficulty sitting down on and standing up from a chair with arms (Griffin.g., wheelchair, bedside commode, etc,.)?: A Little Help needed moving to and from a bed to chair (including a wheelchair)?: A Little Help needed walking Fred Griffin hospital room?: A Little Help needed climbing 3-5 steps with a railing? : A Lot 6 Click Score: 19    End of Session   Activity Tolerance: Patient tolerated treatment well Patient left: Fred Griffin chair;with nursing/sitter Fred Griffin room;with chair alarm set Nurse Communication: Mobility status PT Visit Diagnosis: Difficulty Ein walking, not elsewhere classified (R26.2)    Time: 6195-0932 PT Time Calculation (min) (ACUTE ONLY): 16 min   Charges:   PT Evaluation $PT Eval Low Complexity: 1 Low     PT G CodesCarmelia Griffin, PT,  DPT 12/21/2017 Pager: [phone]  Fred Griffin 12/21/2017, 2:57 PM

## 2017-12-21 NOTE — Evaluation (Signed)
Occupational Therapy Evaluation Patient Details Name: Marquie NUNO BRUBACHER MRN: [id number] DOB: 05/23/1927 Today's Date: 12/21/2017    History of Present Illness This 84 y.o. maled admitted from [facility] due to decreased responsiveness thought to be syncope.  Pt found to be Harshaan A-FIb with RVR - HR 170s.   PMH includes:  CAD, NSTEMI, s/p PCI, stage IV CKD, dementia, h/o Lt PCA CVA 6/18,    Clinical Impression   Pt admitted with above. He demonstrates the below listed deficits and will benefit from continued OT to maximize safety and independence with BADLs.  Pt presents to OT with generalized weakness, decreased activity tolerance, mild balance deficits, and impaired cognition due to h/o dementia.  He currently requires min guard - mod A for ADLs (he was incontinent of urine and assisted with clean up).  He resided at [facility] [place], [place], and per sister, was mod I with ADLs and ambulation with RW.  He endorses two recent falls. Feel he will quickly return to his baseline.  He is very eager to discharge "back home".  Recommend HHOT.       Follow Up Recommendations  Home health OT;Supervision/Assistance - 24 hour    Equipment Recommendations  None recommended by OT    Recommendations for Other Services       Precautions / Restrictions Precautions Precautions: Fall Restrictions Weight Bearing Restrictions: No      Mobility Bed Mobility Overal bed mobility: Needs Assistance Bed Mobility: Supine to Sit     Supine to sit: Min guard     General bed mobility comments: increased effort and time   Transfers Overall transfer level: Needs assistance   Transfers: Sit to/from Stand;Stand Pivot Transfers Sit to Stand: Min guard Stand pivot transfers: Min guard       General transfer comment: min guard for safety     Balance Overall balance assessment: Needs assistance Sitting-balance support: Feet supported Sitting balance-Leahy Scale: Good     Standing balance  support: No upper extremity supported   Standing balance comment: static standing with min guard assist without UE support                            ADL either performed or assessed with clinical judgement   ADL Overall ADL's : Needs assistance/impaired Eating/Feeding: Independent   Grooming: Wash/dry hands;Wash/dry face;Oral care;Brushing hair;Min guard;Standing   Upper Body Bathing: Set up;Supervision/ safety;Sitting   Lower Body Bathing: Minimal assistance;Sit to/from stand   Upper Body Dressing : Set up;Standing;Min guard   Lower Body Dressing: Minimal assistance;Sit to/from stand   Toilet Transfer: Min guard;Stand-pivot;BSC;RW   Toileting- Clothing Manipulation and Hygiene: Maximal assistance;Sit to/from stand Toileting - Clothing Manipulation Details (indicate cue type and reason): Pt incontinent of urine.  Assisted with peri care Nesanel standing      Functional mobility during ADLs: Min guard;Rolling walker       Vision         Perception     Praxis      Pertinent Vitals/Pain Pain Assessment: No/denies pain     Hand Dominance     Extremity/Trunk Assessment Upper Extremity Assessment Upper Extremity Assessment: Generalized weakness   Lower Extremity Assessment Lower Extremity Assessment: Defer to PT evaluation   Cervical / Trunk Assessment Cervical / Trunk Assessment: Normal   Communication Communication Communication: Interpreter utilized;Prefers language other than English(video interpreter [place] # [id number])   Cognition Arousal/Alertness: Awake/alert Behavior During Therapy: WFL for  tasks assessed/performed Overall Cognitive Status: History of cognitive impairments - at baseline                                 General Comments: sister indicates he is at baseline    General Comments  sister present.  Pt emphasizes that he wished to discharge home as soon as possible     Exercises     Shoulder Instructions      Home  Living Family/patient expects to be discharged to:: Assisted living                             Home Equipment: Walker - 2 wheels   Additional Comments: Pt resides at [facility] [place]       Prior Functioning/Environment Level of Independence: Independent with assistive device(s)        Comments: Per pt and sister, he was ambulatory with RW mod I level, and was able to perform ADLs mod I.   He indicates he has had 2 falls         OT Problem List: Decreased activity tolerance;Impaired balance (sitting and/or standing);Decreased cognition;Decreased safety awareness      OT Treatment/Interventions: Self-care/ADL training;DME and/or AE instruction;Therapeutic activities;Patient/family education;Balance training    OT Goals(Current goals can be found [place] the care plan section) Acute Rehab OT Goals Patient Stated Goal: to go home ASAP  OT Goal Formulation: With patient Time For Goal Achievement: 01/04/18 Potential to Achieve Goals: Good ADL Goals Pt Will Perform Grooming: with supervision;standing Pt Will Perform Upper Body Bathing: with set-up;sitting Pt Will Perform Lower Body Bathing: with supervision;sit to/from stand Pt Will Perform Upper Body Dressing: with set-up;sitting Pt Will Perform Lower Body Dressing: with supervision;sit to/from stand Pt Will Transfer to Toilet: with supervision;ambulating;regular height toilet;grab bars Pt Will Perform Toileting - Clothing Manipulation and hygiene: with supervision;sit to/from stand  OT Frequency: Min 2X/week   Barriers to D/C: Decreased caregiver support  resides at ALF        Co-evaluation              AM-PAC PT "6 Clicks" Daily Activity     Outcome Measure Help from another person eating meals?: None Help from another person taking care of personal grooming?: A Little Help from another person toileting, which includes using toliet, bedpan, or urinal?: A Lot Help from another person bathing  (including washing, rinsing, drying)?: A Lot Help from another person to put on and taking off regular upper body clothing?: A Little Help from another person to put on and taking off regular lower body clothing?: A Lot 6 Click Score: 16   End of Session Equipment Utilized During Treatment: Surveyor, mining Communication: Mobility status  Activity Tolerance: Patient tolerated treatment well Patient left: Jakarri chair;with call bell/phone within reach;with chair alarm set;with family/visitor present  OT Visit Diagnosis: Unsteadiness on feet (R26.81);Cognitive communication deficit (R41.841)                Time: 2952-8413 OT Time Calculation (min): 27 min Charges:  OT General Charges $OT Visit: 1 Visit OT Evaluation $OT Eval Moderate Complexity: 1 Mod OT Treatments $Self Care/Home Management : 8-22 mins G-Codes:     [facility], OTR/L [phone]   Lucille Passy M 12/21/2017, 12:31 PM

## 2017-12-21 NOTE — Progress Notes (Signed)
Progress Note  Patient Name: Fred Griffin Date of Encounter: 12/21/2017  Primary Cardiologist: None   Subjective   No apparent pain dementia and does not speak english  Inpatient Medications    Scheduled Meds: . aspirin EC  81 mg Oral Daily  . atorvastatin  40 mg Oral Daily  . carvedilol  6.25 mg Oral BID WC  . famotidine  20 mg Oral Daily  . ferrous sulfate  325 mg Oral Q breakfast  . geriatric multivitamins-minerals  15 mL Oral Daily  . memantine  5 mg Oral BID  . mirtazapine  7.5 mg Oral QHS  . sertraline  25 mg Oral Daily  . sodium bicarbonate  650 mg Oral Daily  . tamsulosin  0.4 mg Oral Daily  . Warfarin - Pharmacist Dosing Inpatient   Does not apply q1800   Continuous Infusions: . heparin 700 Units/hr (12/21/17 0230)   PRN Meds: acetaminophen, hydrALAZINE, ondansetron (ZOFRAN) IV   Vital Signs    Vitals:   12/21/17 0100 12/21/17 0102 12/21/17 0230 12/21/17 0623  BP: (!) 142/118 (!) 142/118 (!) 147/86 (!) 165/84  Pulse:  68 79 74  Resp: (!) 21 19 18 20   Temp:   97.7 F (36.5 C) 98 F (36.7 C)  TempSrc:   Oral Oral  SpO2: 99% 100% 94% 100%  Weight:   123 lb 3.8 oz (55.9 kg)   Height:   5\' 8"  (1.727 m)     Intake/Output Summary (Last 24 hours) at 12/21/2017 0801 Last data filed at 12/21/2017 0867 Gross per 24 hour  Intake 200.58 ml  Output 0 ml  Net 200.58 ml   Filed Weights   12/20/17 0842 12/21/17 0230  Weight: 130 lb (59 kg) 123 lb 3.8 oz (55.9 kg)    Telemetry    NSR rates 70-80 - Personally Reviewed  ECG    NSR lateral T wave changes  - Personally Reviewed  Physical Exam   General: thin frail chinese male . Head: Normocephalic, atraumatic.  Neck: Supple without bruits, JVD . Lungs:  Resp regular and unlabored, CTA. Heart: RRR , S1, S2, no S3, S4, or murmur; no rub. Abdomen: Soft, non-tender, non-distended with normoactive bowel sounds. No hepatomegaly. No rebound/guarding. No obvious abdominal masses. Extremities: No clubbing,  cyanosis,  edema. Distal pedal pulses are 2+ bilaterally. Neuro: Alert and oriented X 3. Moves all extremities spontaneously. Psych: Normal affect.  Labs    Hematology Recent Labs  Lab 12/20/17 0103 12/21/17 0429  WBC 5.9 6.5  RBC 2.98*  2.94* 2.98*  HGB 9.4* 9.5*  HCT 29.0* 28.4*  MCV 97.3 95.3  MCH 31.5 31.9  MCHC 32.4 33.5  RDW 14.4 14.4  PLT 203 239    Chemistry Recent Labs  Lab 12/20/17 0103 12/20/17 0755 12/20/17 1430 12/21/17 0429  NA 139 140 141 142  K 5.8* 5.3* 3.9 4.7  CL 112* 116* 115* 114*  CO2 21* 19* 21* 20*  GLUCOSE 115* 105* 50* 121*  BUN 33* 30* 31* 34*  CREATININE 3.21* 2.96* 3.16* 3.22*  CALCIUM 8.0* 7.3* 8.4* 8.4*  PROT 6.6  --   --   --   ALBUMIN 3.1*  --   --   --   AST 26  --   --   --   ALT 12*  --   --   --   ALKPHOS 64  --   --   --   BILITOT 0.6  --   --   --  GFRNONAA 16* 18* 16* 16*  GFRAA 18* 20* 19* 18*  ANIONGAP 6 5 5 8      Cardiac Enzymes Recent Labs  Lab 12/20/17 0755 12/20/17 1430 12/20/17 2135  TROPONINI 0.54* 0.55* 0.39*    Radiology    Dg Chest Portable 1 View  Result Date: 12/20/2017 CLINICAL DATA:  Syncope EXAM: PORTABLE CHEST 1 VIEW COMPARISON:  07/08/2017, 02/15/2015 FINDINGS: Mild enlargement of the cardiomediastinal silhouette. Hyperinflation. Biapical pleural and parenchymal scarring with mild hilar retraction. Linear atelectasis Geoge the right upper lung. No acute consolidation or effusion. No pneumothorax. Tortuous ectatic aorta. IMPRESSION: 1. Mild cardiomegaly without acute pulmonary infiltrate or edema 2. Stable appearance of biapical pleural and parenchymal scarring. Electronically Signed   By: Donavan Foil M.D.   On: 12/20/2017 02:58     Cardiac Studies   Echo EF 55-60% mild AR   Patient Profile     84 y.o. male who speaks only [place], w/ hx dementia, chronic systolic heart failure with EF 40% (04/2017), CAD with DES/PCI to RCA 07/2014, HTN, CVA 04/2017, CKD stage IV, BPH, Hx of prostate cancer,  who was admitted 02/07 w/ syncope, was Jaun atrial fibrillation with RVR >>cards seeing. Spont conversion to SR prior to admit.  Assessment & Plan    Principal Problem: 1.  New onset atrial fibrillation (HCC) - CHA2DS2-VASc=  7 (age x 2, CVA x 2, CHF, HTN, CAD) - Heparin>>coumadin [place] NSR this am   2. Elevated troponin - peak .63 no evolution no apparent chest pain given age and demential Rx medically no plans for cath or myovue   3. Anemia : significant w/u per primary team  4. CRF: precludes cath EF normal ok to hydrate also makes NOAC less appealing to use for anticoagulation  Will sign off   Jenkins Rouge

## 2017-12-22 DIAGNOSIS — I5042 Chronic combined systolic (congestive) and diastolic (congestive) heart failure: Secondary | ICD-10-CM

## 2017-12-22 LAB — CBC
HEMATOCRIT: 25.6 % — AB (ref 39.0–52.0)
Hemoglobin: 8.7 g/dL — ABNORMAL LOW (ref 13.0–17.0)
MCH: 32.7 pg (ref 26.0–34.0)
MCHC: 34 g/dL (ref 30.0–36.0)
MCV: 96.2 fL (ref 78.0–100.0)
PLATELETS: 205 10*3/uL (ref 150–400)
RBC: 2.66 MIL/uL — ABNORMAL LOW (ref 4.22–5.81)
RDW: 14.5 % (ref 11.5–15.5)
WBC: 5.1 10*3/uL (ref 4.0–10.5)

## 2017-12-22 LAB — BASIC METABOLIC PANEL
Anion gap: 6 (ref 5–15)
BUN: 36 mg/dL — AB (ref 6–20)
CO2: 21 mmol/L — ABNORMAL LOW (ref 22–32)
Calcium: 8.1 mg/dL — ABNORMAL LOW (ref 8.9–10.3)
Chloride: 115 mmol/L — ABNORMAL HIGH (ref 101–111)
Creatinine, Ser: 3.19 mg/dL — ABNORMAL HIGH (ref 0.61–1.24)
GFR calc Af Amer: 19 mL/min — ABNORMAL LOW (ref >60)
GFR, EST NON AFRICAN AMERICAN: 16 mL/min — AB (ref >60)
GLUCOSE: 123 mg/dL — AB (ref 65–99)
POTASSIUM: 5 mmol/L (ref 3.5–5.1)
Sodium: 142 mmol/L (ref 135–145)

## 2017-12-22 LAB — PROTIME-INR
INR: 1.39
PROTHROMBIN TIME: 16.9 s — AB (ref 11.4–15.2)

## 2017-12-22 MED ORDER — WARFARIN SODIUM 4 MG PO TABS: 4.0000 mg | ORAL_TABLET | Freq: Every day | ORAL | 11 refills | Status: AC

## 2017-12-22 NOTE — NC FL2 (Signed)
[place] LEVEL OF CARE SCREENING TOOL     IDENTIFICATION  Patient Name: Fred Griffin Birthdate: 1927/07/28 Sex: male Admission Date (Current Location): 12/20/2017  [facility] and [place] Number:  Herbalist and Address:  [facility],  [place] [address] [place]      Provider Number: [phone]  Attending Physician Name and Address:  Caren Griffins, MD  Relative Name and Phone Number:       Current Level of Care: Hospital Recommended Level of Care: [place] Prior Approval Number:    Date Approved/Denied:   PASRR Number:    Discharge Plan: Other (Comment)(Assisted Living Facility)    Current Diagnoses: Patient Active Problem List   Diagnosis Date Noted  . New onset atrial fibrillation ([place]) 12/20/2017  . CKD (chronic kidney disease) stage 4, GFR 15-29 ml/min (HCC) 12/20/2017  . Acute hyperkalemia 12/20/2017  . CAD (coronary artery disease)-h/o MI/DES RCA 2015 12/20/2017  . Macrocytic anemia 12/20/2017  . Dementia 12/20/2017  . Chronic combined systolic and diastolic heart failure ([place]) 12/20/2017  . History of CVA (cerebrovascular accident) 12/20/2017  . Acute ischemic left posterior cerebral artery (PCA) stroke ([place]) 05/12/2017  . HLD (hyperlipidemia) 05/12/2017  . Dementia with behavioral disturbance 05/12/2017  . GERD (gastroesophageal reflux disease) 05/12/2017  . Iron deficiency anemia 05/12/2017  . CAD [place] native artery   . Hypertension   . Hyperkalemia 08/08/2014  . ST elevation myocardial infarction (STEMI) involving right coronary artery Jakarie recovery phase ([place]) 08/06/2014  . Renal cysts, acquired, bilateral 02/01/2011  . Chronic renal insufficiency, stage IV (severe) ([place]) 11/02/2010  . HYPERGLYCEMIA 11/02/2010  . Essential hypertension 04/07/2010  . BPH (benign prostatic hyperplasia) 04/07/2010  . PROSTATE CANCER, HX OF 04/07/2010    Orientation RESPIRATION BLADDER Height & Weight     Self,  Place  Normal Incontinent Weight: 124 lb 9 oz (56.5 kg) Height:  5\' 8"  (172.7 cm)  BEHAVIORAL SYMPTOMS/MOOD NEUROLOGICAL BOWEL NUTRITION STATUS        Diet(regular chopped foods)  AMBULATORY STATUS COMMUNICATION OF NEEDS Skin   Limited Assist Verbally Normal                       Personal Care Assistance Level of Assistance  Bathing, Feeding, Dressing Bathing Assistance: Limited assistance Feeding assistance: Independent Dressing Assistance: Limited assistance     Functional Limitations Info  Sight, Hearing, Speech Sight Info: Adequate Hearing Info: Adequate Speech Info: Adequate    SPECIAL CARE FACTORS FREQUENCY  PT (By licensed PT)     PT Frequency: HHPT              Contractures Contractures Info: Not present    Additional Factors Info  Code Status, Allergies Code Status Info: Full Code Allergies Info: NKA           Current Medications (12/22/2017):  This is the current hospital active medication list Current Facility-Administered Medications  Medication Dose Route Frequency Provider Last Rate Last Dose  . acetaminophen (TYLENOL) tablet 650 mg  650 mg Oral Q4H PRN Samella Parr, NP      . aspirin EC tablet 81 mg  81 mg Oral Daily Patrecia Pour, MD   81 mg at 12/22/17 1051  . atorvastatin (LIPITOR) tablet 40 mg  40 mg Oral Daily Erin Hearing L, NP   40 mg at 12/22/17 1051  . carvedilol (COREG) tablet 6.25 mg  6.25 mg Oral BID WC Samella Parr,  NP   6.25 mg at 12/22/17 1050  . famotidine (PEPCID) tablet 20 mg  20 mg Oral Daily Erin Hearing L, NP   20 mg at 12/22/17 1051  . ferrous sulfate tablet 325 mg  325 mg Oral Q breakfast Samella Parr, NP   325 mg at 12/22/17 1050  . geriatric multivitamins-minerals (ELDERTONIC/GEVRABON) elixir [name] 15 mL  15 mL Oral Daily Erin Hearing L, NP   15 mL at 12/22/17 1051  . haloperidol lactate (HALDOL) injection 2 mg  2 mg Intravenous Q6H PRN Blount, Scarlette Shorts T, NP      . heparin ADULT infusion 100 units/mL  (25000 units/2107mL sodium chloride 0.45%)  700 Units/hr Intravenous Continuous Adon, Gehlhausen, RPH 7 mL/hr at 12/22/17 0647 700 Units/hr at 12/22/17 0647  . hydrALAZINE (APRESOLINE) injection 10 mg  10 mg Intravenous Q6H PRN Samella Parr, NP      . memantine (NAMENDA) tablet 5 mg  5 mg Oral BID Samella Parr, NP   5 mg at 12/22/17 1051  . mirtazapine (REMERON SOL-TAB) disintegrating tablet 7.5 mg  7.5 mg Oral QHS Samella Parr, NP   7.5 mg at 12/21/17 2154  . ondansetron (ZOFRAN) injection 4 mg  4 mg Intravenous Q6H PRN Samella Parr, NP      . sertraline (ZOLOFT) tablet 25 mg  25 mg Oral Daily Erin Hearing L, NP   25 mg at 12/22/17 1051  . sodium bicarbonate tablet 650 mg  650 mg Oral Daily Samella Parr, NP   650 mg at 12/22/17 1050  . tamsulosin (FLOMAX) capsule 0.4 mg  0.4 mg Oral Daily Erin Hearing L, NP   0.4 mg at 12/22/17 1051  . Warfarin - Pharmacist Dosing Inpatient   Does not apply q1800 Angela Adam [facility]         Discharge Medications: Please see discharge summary for a list of discharge medications.  Relevant Imaging Results:  Relevant Lab Results:   Additional Information SSN  [id number]  Burnis Medin, LCSW

## 2017-12-22 NOTE — Progress Notes (Signed)
Patient from [place]. Patient accompanied by sister who reported plan is for patient to return to ALF. Patient's sister reported that she will transport patient.   CSW spoke with staff member from [place], who confirmed patient's ability to return.  CSW sent clinical documents, staff reported that she did not need to confirm receipt prior to patient's arrival.   Patient's sister will transport patient, no packet needed.   CSW signing off, no other needs identified at this time.  Abundio Miu, [place] Social Worker [facility] Cell#: [phone]

## 2017-12-22 NOTE — Discharge Summary (Signed)
Physician Discharge Summary  Fred Griffin BTD:[id number] DOB: 03-31-1927 [name]: 12/20/2017  PCP: Fred Griffin, Fred R, DO  Admit date: 12/20/2017 Discharge date: 12/22/2017  Admitted From: SNF Disposition:  SNF  Recommendations for Outpatient Follow-up:  1. Follow up with PCP Deangelo 1-2 weeks 2. Patient started on Coumadin for paroxysmal A fib, repeat INR [place] 2 days and adjust Coumadin as indicated   Home Health: none Equipment/Devices: none  Discharge Condition: stable CODE STATUS: Full code Diet recommendation: heart healthy  HPI: Per Dr. Bonner Puna, 84 y.o. male with a history of chronic HFrEF, CAD, NSTEMI s/p PCI, stage IV CKD who presented from [facility] due to diminished responsiveness on the commode, felt to be syncopal event found to be Vann AFib (new Dx) with RVR into the 170's. On my evaluation, used [place] [employer] video interpretor [name] [phone], pt is amnestic about events, recall limited by dementia, though he reports he has no current symptoms. Usually gets around with walker/wheelchair. He is nontoxic-appearing with sinus rhythm and frequent PACs, lungs clear, no JVD. Trace LE edema. Troponins elevated to 0.54 without anginal symptoms, and CHADSVASc is 7, so started heparin and will bridge to coumadin. TSH ok, checking echo. Appreciate cardiology assistance. Taking measures to treat hyperkalemia, SCr at historically worsening baseline. Recheck BMP [name].  Hospital Course: New onset atrial fibrillation with RVR, paroxysmal A fib -converted to sinus rhythm now, patient is on Coreg, continue. Cardiology consulted and has followed patient while hospitalized. He was initially started on heparin and Coumadin, will need to continue Coumadin on discharge. Goal INR is 2-3, please monitor INR as per protocol. Echocardiogram done on 2/7 showed normal EF (full report below). TSH normal Coronary artery disease -No chest pain, cardiology following. Slight troponin elevation likely due to demand ischemia Emmette the  setting of A fib with RVR Chronic kidney disease stage IV -Creatinine appears close to baseline around 3, avoid nephrotoxic agents Chronic combined systolic and diastolic CHF -EF is within normal limits currently, 2D echo as below Hyperkalemia -Likely [name] the setting of chronic kidney disease, potassium now improved Mild dementia -Stable Microcytic anemia -Likely [name] the setting of chronic kidney disease, stable    Discharge Diagnoses:  Principal Problem:   New onset atrial fibrillation ([place]) Active Problems:   CKD (chronic kidney disease) stage 4, GFR 15-29 ml/min (HCC)   Acute hyperkalemia   CAD (coronary artery disease)-h/o MI/DES RCA 2015   Macrocytic anemia   Dementia   Chronic combined systolic and diastolic heart failure (HCC)   History of CVA (cerebrovascular accident)     Discharge Instructions   Allergies as of 12/22/2017   No Known Allergies     Medication List    TAKE these medications   aspirin 81 MG EC tablet Take 1 tablet (81 mg total) by mouth daily.   atorvastatin 40 MG tablet Commonly known as:  LIPITOR Take 40 mg by mouth daily.   carvedilol 6.25 MG tablet Commonly known as:  COREG Take 1 tablet (6.25 mg total) by mouth 2 (two) times daily with a meal.   eucerin cream Apply 1 application topically at bedtime. to feet   geriatric multivitamins-minerals [name] Take 15 mLs by mouth daily.   hydrALAZINE 10 MG tablet Commonly known as:  APRESOLINE Take 10 mg by mouth 3 (three) times daily.   memantine 5 MG tablet Commonly known as:  NAMENDA Take 5 mg by mouth 2 (two) times daily. for memory   mirtazapine 15 MG disintegrating tablet Commonly known  as:  REMERON SOL-TAB Take 7.5 mg by mouth at bedtime.   ranitidine 75 MG tablet Commonly known as:  ZANTAC Take 75 mg by mouth at bedtime.   sertraline 25 MG tablet Commonly known as:  ZOLOFT Take 25 mg by mouth daily.   SM IRON 325 (65 FE) MG tablet Generic drug:  ferrous sulfate Take 325 mg  by mouth daily with breakfast.   sodium bicarbonate 650 MG tablet Take 650 mg by mouth daily.   tamsulosin 0.4 MG Caps capsule Commonly known as:  FLOMAX Take 0.4 mg by mouth daily.   warfarin 4 MG tablet Commonly known as:  COUMADIN Take 1 tablet (4 mg total) by mouth daily.        Consultations:  Cardiology \  Procedures/Studies:  2D echo Impressions: - Apical hypokinesis with overall normal LV systolic function;moderate LVH; mild diastolic dysfunction; mild AI; mildly dilatedaortic root (4.4 cm); mild RAE; mild TR with mildly elevatedpulmonary pressure.   Dg Chest Portable 1 View  Result Date: 12/20/2017 CLINICAL DATA:  Syncope EXAM: PORTABLE CHEST 1 VIEW COMPARISON:  07/08/2017, 02/15/2015 FINDINGS: Mild enlargement of the cardiomediastinal silhouette. Hyperinflation. Biapical pleural and parenchymal scarring with mild hilar retraction. Linear atelectasis [name] the right upper lung. No acute consolidation or effusion. No pneumothorax. Tortuous ectatic aorta. IMPRESSION: 1. Mild cardiomegaly without acute pulmonary infiltrate or edema 2. Stable appearance of biapical pleural and parenchymal scarring. Electronically Signed   By: Donavan Foil M.D.   On: 12/20/2017 02:58     Subjective: - no chest pain, shortness of breath, no abdominal pain, nausea or vomiting.   Discharge Exam: Vitals:   12/21/17 2048 12/22/17 0521  BP: (!) 155/82 (!) 134/101  Pulse: 78 80  Resp: 18 18  Temp: (!) 97.5 F (36.4 C) 98.1 F (36.7 C)  SpO2: 100% 100%    General: Pt is alert, awake, not [name] acute distress Cardiovascular: RRR, S1/S2 +, no rubs, no gallops Respiratory: CTA bilaterally, no wheezing, no rhonchi Abdominal: Soft, NT, ND, bowel sounds + Extremities: no edema, no cyanosis    The results of significant diagnostics from this hospitalization (including imaging, microbiology, ancillary and laboratory) are listed below for reference.     Microbiology: Recent Results (from  the past 240 hour(s))  MRSA PCR Screening     Status: None   Collection Time: 12/21/17  2:53 AM  Result Value Ref Range Status   MRSA by PCR NEGATIVE NEGATIVE Final    Comment:        The GeneXpert MRSA Assay (FDA approved for NASAL specimens only), is one component of a comprehensive MRSA colonization surveillance program. It is not intended to diagnose MRSA infection nor to guide or monitor treatment for MRSA infections. Performed at [facility], [place] [address] [place], [place] [address]      Labs: BNP (last 3 results) No results for input(s): BNP Brach the last 8760 hours. Basic Metabolic Panel: Recent Labs  Lab 12/20/17 0103 12/20/17 0755 12/20/17 1430 12/21/17 0429 12/22/17 0804  NA 139 140 141 142 142  K 5.8* 5.3* 3.9 4.7 5.0  CL 112* 116* 115* 114* 115*  CO2 21* 19* 21* 20* 21*  GLUCOSE 115* 105* 50* 121* 123*  BUN 33* 30* 31* 34* 36*  CREATININE 3.21* 2.96* 3.16* 3.22* 3.19*  CALCIUM 8.0* 7.3* 8.4* 8.4* 8.1*  MG  --  1.8  --   --   --    Liver Function Tests: Recent Labs  Lab 12/20/17 0103  AST 26  ALT 12*  ALKPHOS 64  BILITOT 0.6  PROT 6.6  ALBUMIN 3.1*   No results for input(s): LIPASE, AMYLASE Darril the last 168 hours. No results for input(s): AMMONIA Caleel the last 168 hours. CBC: Recent Labs  Lab 12/20/17 0103 12/21/17 0429 12/22/17 0804  WBC 5.9 6.5 5.1  HGB 9.4* 9.5* 8.7*  HCT 29.0* 28.4* 25.6*  MCV 97.3 95.3 96.2  PLT 203 239 205   Cardiac Enzymes: Recent Labs  Lab 12/20/17 0755 12/20/17 1430 12/20/17 2135  TROPONINI 0.54* 0.55* 0.39*   BNP: Invalid input(s): POCBNP CBG: Recent Labs  Lab 12/20/17 0056  GLUCAP 93   D-Dimer No results for input(s): DDIMER Ishaaq the last 72 hours. Hgb A1c No results for input(s): HGBA1C Luka the last 72 hours. Lipid Profile Recent Labs    12/21/17 0429  CHOL 133  HDL 54  LDLCALC 67  TRIG 59  CHOLHDL 2.5   Thyroid function studies Recent Labs    12/20/17 0755  TSH  0.732   Anemia work up Recent Labs    12/20/17 0103 12/20/17 0855  VITAMINB12  --  401  FOLATE  --  10.3  FERRITIN  --  247  TIBC  --  196*  IRON  --  50  RETICCTPCT 1.2  --    Urinalysis    Component Value Date/Time   COLORURINE YELLOW 12/20/2017 0512   APPEARANCEUR CLEAR 12/20/2017 0512   LABSPEC 1.014 12/20/2017 0512   PHURINE 6.0 12/20/2017 0512   GLUCOSEU 50 (A) 12/20/2017 0512   HGBUR NEGATIVE 12/20/2017 0512   BILIRUBINUR NEGATIVE 12/20/2017 0512   KETONESUR NEGATIVE 12/20/2017 0512   PROTEINUR 100 (A) 12/20/2017 0512   UROBILINOGEN 0.2 12/14/2014 1315   NITRITE NEGATIVE 12/20/2017 0512   LEUKOCYTESUR NEGATIVE 12/20/2017 0512   Sepsis Labs Invalid input(s): PROCALCITONIN,  WBC,  LACTICIDVEN   Time coordinating discharge: > 30 minutes  SIGNED:  Marzetta Board, MD  [facility] 12/22/2017, 10:05 AM Pager [phone]  If 7PM-7AM, please contact night-coverage [email or web address] Password [email or web address]

## 2017-12-22 NOTE — Progress Notes (Signed)
Progress Note  Patient Name: Fred Griffin Date of Encounter: 12/22/2017  Primary Cardiologist: None   Subjective   No apparent pain dementia and does not speak english  Inpatient Medications    Scheduled Meds: . aspirin EC  81 mg Oral Daily  . atorvastatin  40 mg Oral Daily  . carvedilol  6.25 mg Oral BID WC  . famotidine  20 mg Oral Daily  . ferrous sulfate  325 mg Oral Q breakfast  . geriatric multivitamins-minerals  15 mL Oral Daily  . memantine  5 mg Oral BID  . mirtazapine  7.5 mg Oral QHS  . sertraline  25 mg Oral Daily  . sodium bicarbonate  650 mg Oral Daily  . tamsulosin  0.4 mg Oral Daily  . Warfarin - Pharmacist Dosing Inpatient   Does not apply q1800   Continuous Infusions: . heparin 700 Units/hr (12/22/17 0647)   PRN Meds: acetaminophen, haloperidol lactate, hydrALAZINE, ondansetron (ZOFRAN) IV   Vital Signs    Vitals:   12/21/17 0623 12/21/17 1304 12/21/17 2048 12/22/17 0521  BP: (!) 165/84 111/78 (!) 155/82 (!) 134/101  Pulse: 74 70 78 80  Resp: 20 16 18 18   Temp: 98 F (36.7 C) 97.6 F (36.4 C) (!) 97.5 F (36.4 C) 98.1 F (36.7 C)  TempSrc: Oral Oral Oral Oral  SpO2: 100% 100% 100% 100%  Weight:    124 lb 9 oz (56.5 kg)  Height:        Intake/Output Summary (Last 24 hours) at 12/22/2017 0839 Last data filed at 12/22/2017 0200 Gross per 24 hour  Intake 120 ml  Output 200 ml  Net -80 ml   Filed Weights   12/20/17 0842 12/21/17 0230 12/22/17 0521  Weight: 130 lb (59 kg) 123 lb 3.8 oz (55.9 kg) 124 lb 9 oz (56.5 kg)    Telemetry    NSR rates 70-80 - Personally Reviewed  ECG    NSR lateral T wave changes  - Personally Reviewed  Physical Exam   General: thin frail chinese male . Head: Normocephalic, atraumatic.  Neck: Supple without bruits, JVD . Lungs:  Resp regular and unlabored, CTA. Heart: RRR , S1, S2, no S3, S4, or murmur; no rub. Abdomen: Soft, non-tender, non-distended with normoactive bowel sounds. No hepatomegaly. No  rebound/guarding. No obvious abdominal masses. Extremities: No clubbing, cyanosis,  edema. Distal pedal pulses are 2+ bilaterally. Neuro: Alert and oriented X 3. Moves all extremities spontaneously. Psych: Normal affect.  Labs    Hematology Recent Labs  Lab 12/20/17 0103 12/21/17 0429 12/22/17 0804  WBC 5.9 6.5 5.1  RBC 2.98*  2.94* 2.98* 2.66*  HGB 9.4* 9.5* 8.7*  HCT 29.0* 28.4* 25.6*  MCV 97.3 95.3 96.2  MCH 31.5 31.9 32.7  MCHC 32.4 33.5 34.0  RDW 14.4 14.4 14.5  PLT 203 239 205    Chemistry Recent Labs  Lab 12/20/17 0103 12/20/17 0755 12/20/17 1430 12/21/17 0429  NA 139 140 141 142  K 5.8* 5.3* 3.9 4.7  CL 112* 116* 115* 114*  CO2 21* 19* 21* 20*  GLUCOSE 115* 105* 50* 121*  BUN 33* 30* 31* 34*  CREATININE 3.21* 2.96* 3.16* 3.22*  CALCIUM 8.0* 7.3* 8.4* 8.4*  PROT 6.6  --   --   --   ALBUMIN 3.1*  --   --   --   AST 26  --   --   --   ALT 12*  --   --   --  ALKPHOS 64  --   --   --   BILITOT 0.6  --   --   --   GFRNONAA 16* 18* 16* 16*  GFRAA 18* 20* 19* 18*  ANIONGAP 6 5 5 8      Cardiac Enzymes Recent Labs  Lab 12/20/17 0755 12/20/17 1430 12/20/17 2135  TROPONINI 0.54* 0.55* 0.39*    Radiology    Dg Chest Portable 1 View  Result Date: 12/20/2017 CLINICAL DATA:  Syncope EXAM: PORTABLE CHEST 1 VIEW COMPARISON:  07/08/2017, 02/15/2015 FINDINGS: Mild enlargement of the cardiomediastinal silhouette. Hyperinflation. Biapical pleural and parenchymal scarring with mild hilar retraction. Linear atelectasis Ladarius the right upper lung. No acute consolidation or effusion. No pneumothorax. Tortuous ectatic aorta. IMPRESSION: 1. Mild cardiomegaly without acute pulmonary infiltrate or edema 2. Stable appearance of biapical pleural and parenchymal scarring. Electronically Signed   By: Donavan Foil M.D.   On: 12/20/2017 02:58     Cardiac Studies   Echo EF 55-60% mild AR  Telemetry :  NSR rates 60-80 bpm  Patient Profile     84 y.o. male who speaks only  [place], w/ hx dementia, chronic systolic heart failure with EF 40% (04/2017), CAD with DES/PCI to RCA 07/2014, HTN, CVA 04/2017, CKD stage IV, BPH, Hx of prostate cancer, who was admitted 02/07 w/ syncope, was [place] atrial fibrillation with RVR >>cards seeing. Spont conversion to SR prior to admit.  Assessment & Plan    Principal Problem: 1.  New onset atrial fibrillation ([place]) CHA2VASC 7 with renal issues starting coumadin Can stop heparin when INR over 2 today 1.39   2. Elevated troponin - peak .51 no evolution no apparent chest pain given age and demential Rx medically no plans for cath or myovue   3. Anemia : significant w/u per primary team  4. CRF: precludes cath EF normal ok to hydrate also makes NOAC less appealing to use for anticoagulation  Will sign off   Jenkins Rouge

## 2019-05-11 IMAGING — DX DG CHEST 1V PORT
1 series · 1 of 1 positions shown · non-contrast
Comparison: 07/08/2017, 02/15/2015

CLINICAL DATA: Syncope

EXAM:
PORTABLE CHEST 1 VIEW

[chest ap]
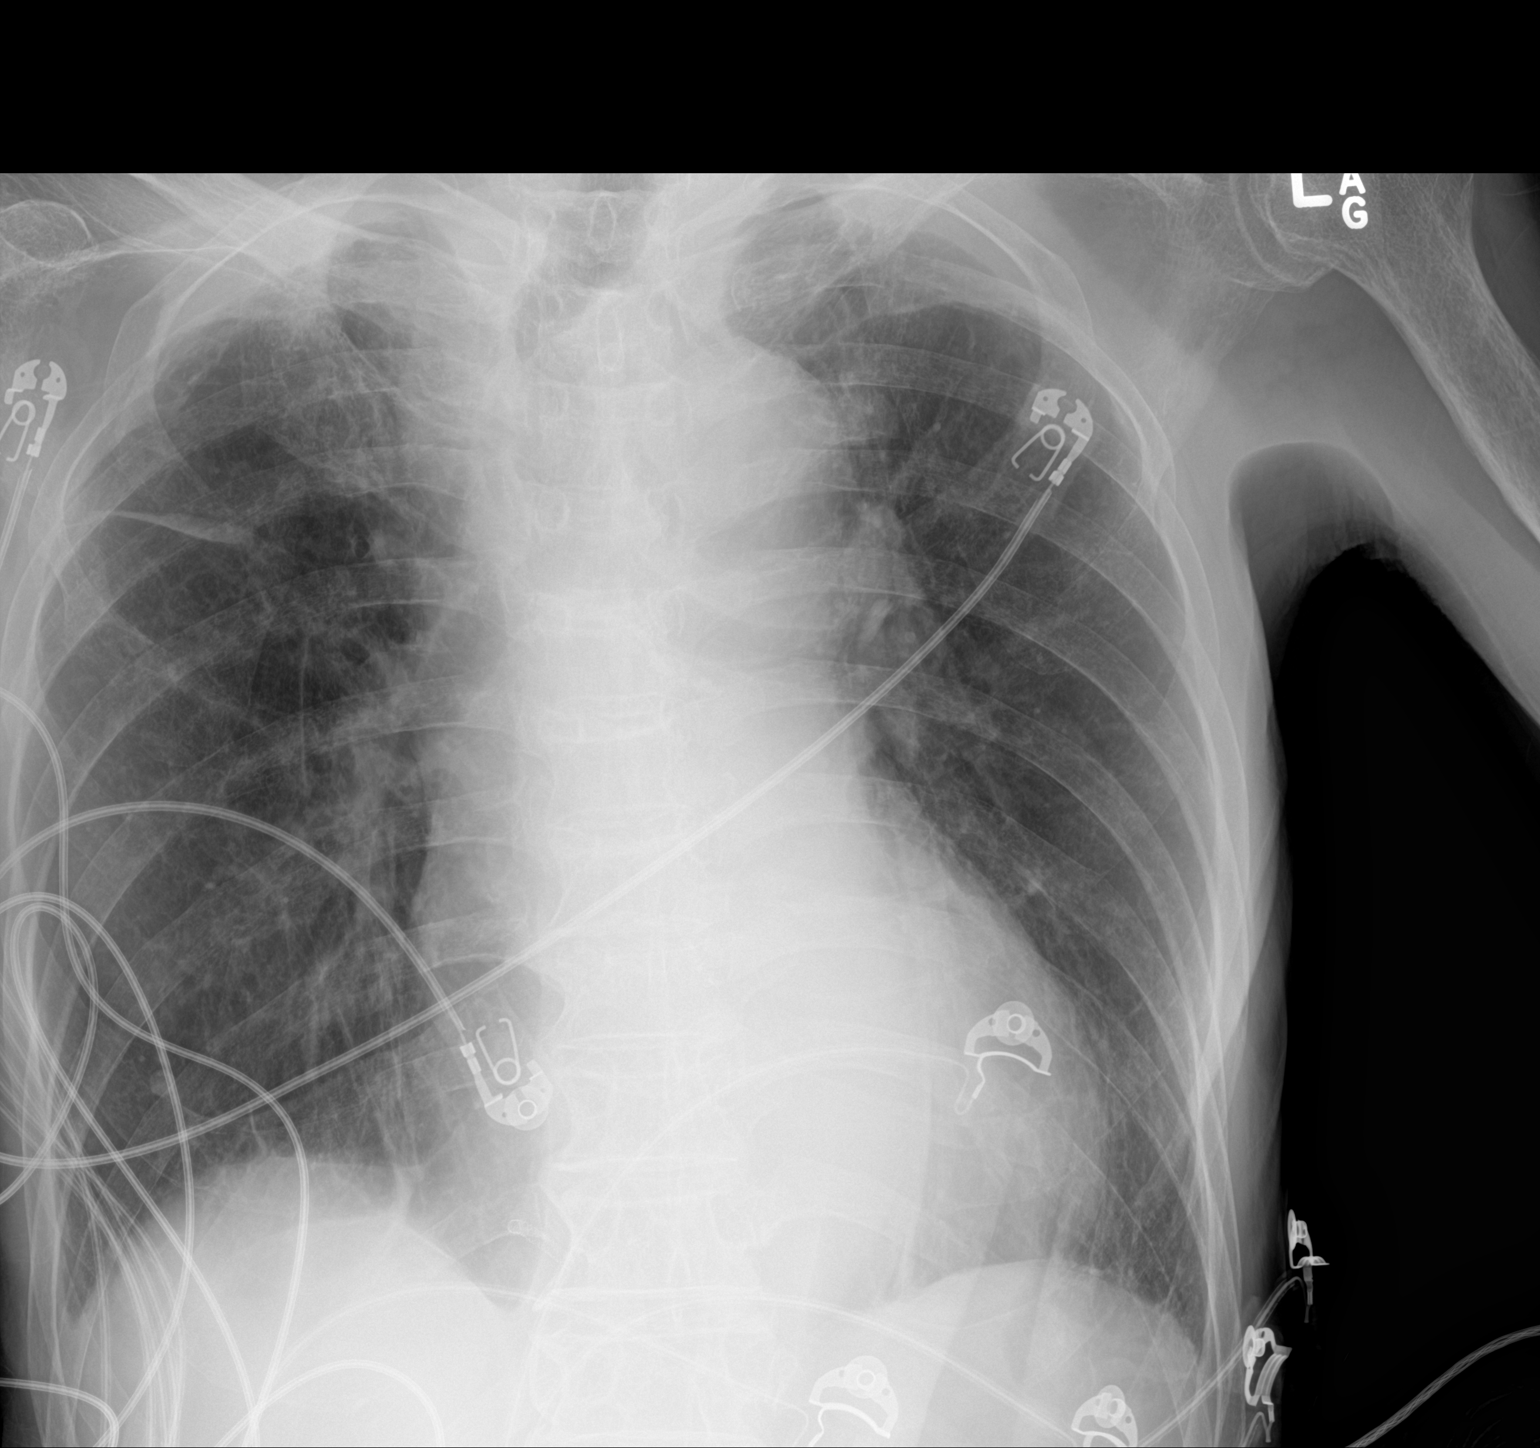

[1 of 1 positions shown; findings below may reference images not displayed]

FINDINGS: Mild enlargement of the cardiomediastinal silhouette.
Hyperinflation. Biapical pleural and parenchymal scarring with mild
hilar retraction. Linear atelectasis in the right upper lung. No
acute consolidation or effusion. No pneumothorax. Tortuous ectatic
aorta.
IMPRESSION: 1. Mild cardiomegaly without acute pulmonary infiltrate or edema
2. Stable appearance of biapical pleural and parenchymal scarring.

## 2020-02-12 DEATH — deceased
